# Patient Record
Sex: Female | Born: 1980 | Race: Black or African American | Hispanic: No | Marital: Married | Smoking: Never smoker
Health system: Southern US, Community
[De-identification: ages and names within clinical notes are randomized; demographics above are authoritative.]

## PROBLEM LIST (undated history)

## (undated) DIAGNOSIS — F32A Depression, unspecified: Secondary | ICD-10-CM

## (undated) DIAGNOSIS — D6800 Von Willebrand disease, unspecified: Secondary | ICD-10-CM

## (undated) DIAGNOSIS — G43909 Migraine, unspecified, not intractable, without status migrainosus: Secondary | ICD-10-CM

## (undated) DIAGNOSIS — F329 Major depressive disorder, single episode, unspecified: Secondary | ICD-10-CM

## (undated) DIAGNOSIS — I10 Essential (primary) hypertension: Secondary | ICD-10-CM

## (undated) DIAGNOSIS — J439 Emphysema, unspecified: Secondary | ICD-10-CM

## (undated) DIAGNOSIS — R51 Headache: Secondary | ICD-10-CM

## (undated) DIAGNOSIS — R519 Headache, unspecified: Secondary | ICD-10-CM

## (undated) DIAGNOSIS — R011 Cardiac murmur, unspecified: Secondary | ICD-10-CM

## (undated) DIAGNOSIS — E282 Polycystic ovarian syndrome: Secondary | ICD-10-CM

## (undated) HISTORY — DX: Depression, unspecified: F32.A

## (undated) HISTORY — DX: Major depressive disorder, single episode, unspecified: F32.9

## (undated) HISTORY — DX: Cardiac murmur, unspecified: R01.1

## (undated) HISTORY — PX: DILATION AND CURETTAGE OF UTERUS: SHX78

## (undated) HISTORY — DX: Emphysema, unspecified: J43.9

## (undated) HISTORY — DX: Headache, unspecified: R51.9

## (undated) HISTORY — DX: Migraine, unspecified, not intractable, without status migrainosus: G43.909

## (undated) HISTORY — DX: Essential (primary) hypertension: I10

## (undated) HISTORY — DX: Headache: R51

## (undated) HISTORY — PX: ABDOMINAL HYSTERECTOMY: SHX81

---

## 2009-09-18 HISTORY — PX: CHOLECYSTECTOMY: SHX55

## 2015-05-03 ENCOUNTER — Encounter: Payer: Self-pay | Admitting: Emergency Medicine

## 2015-05-03 ENCOUNTER — Emergency Department
Admission: EM | Admit: 2015-05-03 | Discharge: 2015-05-03 | Disposition: A | Discharge status: Home / Self Care | Payer: Managed Care, Other (non HMO) | Attending: Emergency Medicine | Admitting: Emergency Medicine

## 2015-05-03 ENCOUNTER — Emergency Department: Payer: Managed Care, Other (non HMO)

## 2015-05-03 DIAGNOSIS — Z3202 Encounter for pregnancy test, result negative: Secondary | ICD-10-CM | POA: Diagnosis not present

## 2015-05-03 DIAGNOSIS — R102 Pelvic and perineal pain: Secondary | ICD-10-CM | POA: Insufficient documentation

## 2015-05-03 DIAGNOSIS — R1032 Left lower quadrant pain: Secondary | ICD-10-CM | POA: Insufficient documentation

## 2015-05-03 HISTORY — DX: Polycystic ovarian syndrome: E28.2

## 2015-05-03 HISTORY — DX: Von Willebrand's disease: D68.0

## 2015-05-03 HISTORY — DX: Von Willebrand disease, unspecified: D68.00

## 2015-05-03 LAB — URINALYSIS COMPLETE WITH MICROSCOPIC (ARMC ONLY)
BACTERIA UA: NONE SEEN
Bilirubin Urine: NEGATIVE
Glucose, UA: NEGATIVE mg/dL
Hgb urine dipstick: NEGATIVE
Ketones, ur: NEGATIVE mg/dL
Leukocytes, UA: NEGATIVE
Nitrite: NEGATIVE
PH: 5 (ref 5.0–8.0)
PROTEIN: NEGATIVE mg/dL
Specific Gravity, Urine: 1.025 (ref 1.005–1.030)

## 2015-05-03 LAB — CBC
HEMATOCRIT: 42.5 % (ref 35.0–47.0)
HEMOGLOBIN: 13.8 g/dL (ref 12.0–16.0)
MCH: 27.1 pg (ref 26.0–34.0)
MCHC: 32.5 g/dL (ref 32.0–36.0)
MCV: 83.3 fL (ref 80.0–100.0)
Platelets: 352 10*3/uL (ref 150–440)
RBC: 5.1 MIL/uL (ref 3.80–5.20)
RDW: 13.2 % (ref 11.5–14.5)
WBC: 7.4 10*3/uL (ref 3.6–11.0)

## 2015-05-03 LAB — COMPREHENSIVE METABOLIC PANEL
ALBUMIN: 4.6 g/dL (ref 3.5–5.0)
ALK PHOS: 77 U/L (ref 38–126)
ALT: 40 U/L (ref 14–54)
ANION GAP: 9 (ref 5–15)
AST: 29 U/L (ref 15–41)
BUN: 11 mg/dL (ref 6–20)
CHLORIDE: 104 mmol/L (ref 101–111)
CO2: 24 mmol/L (ref 22–32)
Calcium: 9.4 mg/dL (ref 8.9–10.3)
Creatinine, Ser: 0.84 mg/dL (ref 0.44–1.00)
GFR calc Af Amer: >60 mL/min (ref >60)
GFR calc non Af Amer: >60 mL/min (ref >60)
GLUCOSE: 91 mg/dL (ref 65–99)
POTASSIUM: 3.7 mmol/L (ref 3.5–5.1)
SODIUM: 137 mmol/L (ref 135–145)
Total Bilirubin: 0.6 mg/dL (ref 0.3–1.2)
Total Protein: 8.9 g/dL — ABNORMAL HIGH (ref 6.5–8.1)

## 2015-05-03 LAB — PREGNANCY, URINE: PREG TEST UR: NEGATIVE

## 2015-05-03 LAB — LIPASE, BLOOD: Lipase: 15 U/L — ABNORMAL LOW (ref 22–51)

## 2015-05-03 NOTE — Discharge Instructions (Signed)
Please seek medical attention for any high fevers, chest pain, shortness of breath, change in behavior, persistent vomiting, bloody stool or any other new or concerning symptoms. ° °Abdominal Pain, Women °Abdominal (stomach, pelvic, or belly) pain can be caused by many things. It is important to tell your doctor: °· The location of the pain. °· Does it come and go or is it present all the time? °· Are there things that start the pain (eating certain foods, exercise)? °· Are there other symptoms associated with the pain (fever, nausea, vomiting, diarrhea)? °All of this is helpful to know when trying to find the cause of the pain. °CAUSES  °· Stomach: virus or bacteria infection, or ulcer. °· Intestine: appendicitis (inflamed appendix), regional ileitis (Crohn's disease), ulcerative colitis (inflamed colon), irritable bowel syndrome, diverticulitis (inflamed diverticulum of the colon), or cancer of the stomach or intestine. °· Gallbladder disease or stones in the gallbladder. °· Kidney disease, kidney stones, or infection. °· Pancreas infection or cancer. °· Fibromyalgia (pain disorder). °· Diseases of the female organs: °¨ Uterus: fibroid (non-cancerous) tumors or infection. °¨ Fallopian tubes: infection or tubal pregnancy. °¨ Ovary: cysts or tumors. °¨ Pelvic adhesions (scar tissue). °¨ Endometriosis (uterus lining tissue growing in the pelvis and on the pelvic organs). °¨ Pelvic congestion syndrome (female organs filling up with blood just before the menstrual period). °¨ Pain with the menstrual period. °¨ Pain with ovulation (producing an egg). °¨ Pain with an IUD (intrauterine device, birth control) in the uterus. °¨ Cancer of the female organs. °· Functional pain (pain not caused by a disease, may improve without treatment). °· Psychological pain. °· Depression. °DIAGNOSIS  °Your doctor will decide the seriousness of your pain by doing an examination. °· Blood tests. °· X-rays. °· Ultrasound. °· CT scan  (computed tomography, special type of X-ray). °· MRI (magnetic resonance imaging). °· Cultures, for infection. °· Barium enema (dye inserted in the large intestine, to better view it with X-rays). °· Colonoscopy (looking in intestine with a lighted tube). °· Laparoscopy (minor surgery, looking in abdomen with a lighted tube). °· Major abdominal exploratory surgery (looking in abdomen with a large incision). °TREATMENT  °The treatment will depend on the cause of the pain.  °· Many cases can be observed and treated at home. °· Over-the-counter medicines recommended by your caregiver. °· Prescription medicine. °· Antibiotics, for infection. °· Birth control pills, for painful periods or for ovulation pain. °· Hormone treatment, for endometriosis. °· Nerve blocking injections. °· Physical therapy. °· Antidepressants. °· Counseling with a psychologist or psychiatrist. °· Minor or major surgery. °HOME CARE INSTRUCTIONS  °· Do not take laxatives, unless directed by your caregiver. °· Take over-the-counter pain medicine only if ordered by your caregiver. Do not take aspirin because it can cause an upset stomach or bleeding. °· Try a clear liquid diet (broth or water) as ordered by your caregiver. Slowly move to a bland diet, as tolerated, if the pain is related to the stomach or intestine. °· Have a thermometer and take your temperature several times a day, and record it. °· Bed rest and sleep, if it helps the pain. °· Avoid sexual intercourse, if it causes pain. °· Avoid stressful situations. °· Keep your follow-up appointments and tests, as your caregiver orders. °· If the pain does not go away with medicine or surgery, you may try: °¨ Acupuncture. °¨ Relaxation exercises (yoga, meditation). °¨ Group therapy. °¨ Counseling. °SEEK MEDICAL CARE IF:  °· You notice certain foods cause stomach   pain. °· Your home care treatment is not helping your pain. °· You need stronger pain medicine. °· You want your IUD removed. °· You  feel faint or lightheaded. °· You develop nausea and vomiting. °· You develop a rash. °· You are having side effects or an allergy to your medicine. °SEEK IMMEDIATE MEDICAL CARE IF:  °· Your pain does not go away or gets worse. °· You have a fever. °· Your pain is felt only in portions of the abdomen. The right side could possibly be appendicitis. The left lower portion of the abdomen could be colitis or diverticulitis. °· You are passing blood in your stools (bright red or black tarry stools, with or without vomiting). °· You have blood in your urine. °· You develop chills, with or without a fever. °· You pass out. °MAKE SURE YOU:  °· Understand these instructions. °· Will watch your condition. °· Will get help right away if you are not doing well or get worse. °Document Released: 07/02/2007 Document Revised: 01/19/2014 Document Reviewed: 07/22/2009 °[employer]® Patient Information ©2015 [employer]. This information is not intended to replace advice given to you by your health care provider. Make sure you discuss any questions you have with your health care provider. ° °

## 2015-05-03 NOTE — ED Notes (Signed)
MD at bedside. 

## 2015-05-03 NOTE — ED Provider Notes (Signed)
[facility] Emergency Department Provider Note   ____________________________________________  Time seen: 1605  I have reviewed the triage vital signs and the nursing notes.   HISTORY  Chief Complaint Abdominal Pain   History limited by: Not Limited   HPI Julie Alexander is a 34 y.o. female with history of partial hysterectomy who presents to the emergency department today with concerns for left adnexal pain. Patient states she has a history of PCO last. She has been having pain in her ovaries for the past year. She has not seen a doctor about this yet. She states that 3 weeks ago she had an intense episode of pain in her right lower quadrant which caused her to clams. Today however the pain is most severe in the left side. She has not noticed any fevers. She denies any abnormal defecation or urination. She has not had any abnormal vaginal discharge with this pain. States she has some concerns As cancer runs in her family.     Past Medical History  Diagnosis Date  . PCOS (polycystic ovarian syndrome)   . Von Willebrand disease     There are no active problems to display for this patient.   Past Surgical History  Procedure Laterality Date  . Abdominal hysterectomy      partial  . Cholecystectomy  2011    No current outpatient prescriptions on file.  Allergies Review of patient's allergies indicates no known allergies.  No family history on file.  Social History Social History  Substance Use Topics  . Smoking status: Never Smoker   . Smokeless tobacco: None  . Alcohol Use: No    Review of Systems  Constitutional: Negative for fever. Cardiovascular: Negative for chest pain. Respiratory: Negative for shortness of breath. Gastrointestinal: Left lower quadrant pain Genitourinary: Negative for dysuria. Musculoskeletal: Negative for back pain. Skin: Negative for rash. Neurological: Negative for headaches, focal weakness or  numbness.   10-point ROS otherwise negative.  ____________________________________________   PHYSICAL EXAM:  VITAL SIGNS: ED Triage Vitals  Enc Vitals Group     BP 05/03/15 1445 152/103 mmHg     Pulse Rate 05/03/15 1445 83     Resp 05/03/15 1445 19     Temp 05/03/15 1445 97.9 F (36.6 C)     Temp Source 05/03/15 1445 Oral     SpO2 05/03/15 1445 100 %     Weight 05/03/15 1445 200 lb (90.719 kg)     Height 05/03/15 1445  (1.575 m)     Head Cir --      Peak Flow --      Pain Score 05/03/15 1446 8   Constitutional: Alert and oriented. Well appearing and in no distress. Eyes: Conjunctivae are normal. PERRL. Normal extraocular movements. ENT   Head: Normocephalic and atraumatic.   Nose: No congestion/rhinnorhea.   Mouth/Throat: Mucous membranes are moist.   Neck: No stridor. Hematological/Lymphatic/Immunilogical: No cervical lymphadenopathy. Cardiovascular: Normal rate, regular rhythm.  No murmurs, rubs, or gallops. Respiratory: Normal respiratory effort without tachypnea nor retractions. Breath sounds are clear and equal bilaterally. No wheezes/rales/rhonchi. Gastrointestinal: Soft. Tender to palpation in the left lower quadrant, left adnexa. No rebound. No guarding. Genitourinary: Deferred Musculoskeletal: Normal range of motion in all extremities. No joint effusions.  No lower extremity tenderness nor edema. Neurologic:  Normal speech and language. No gross focal neurologic deficits are appreciated. Speech is normal.  Skin:  Skin is warm, dry and intact. No rash noted. Psychiatric: Mood and affect are normal. Speech  and behavior are normal. Patient exhibits appropriate insight and judgment.  ____________________________________________    LABS (pertinent positives/negatives)  Labs Reviewed  LIPASE, BLOOD - Abnormal; Notable for the following:    Lipase 15 (*)    All other components within normal limits  COMPREHENSIVE METABOLIC PANEL - Abnormal;  Notable for the following:    Total Protein 8.9 (*)    All other components within normal limits  URINALYSIS COMPLETEWITH MICROSCOPIC ([facility] ONLY) - Abnormal; Notable for the following:    Color, Urine YELLOW (*)    APPearance CLEAR (*)    Squamous Epithelial / LPF 0-5 (*)    All other components within normal limits  CBC  PREGNANCY, URINE     ____________________________________________   EKG  None  ____________________________________________    RADIOLOGY  Transvaginal US IMPRESSION: Status post hysterectomy. Unremarkable pelvic ultrasound. No evidence for ovarian torsion.  ____________________________________________   PROCEDURES  Procedure(s) performed: None  Critical Care performed: No  ____________________________________________   INITIAL IMPRESSION / ASSESSMENT AND PLAN / ED COURSE  Pertinent labs & imaging results that were available during my care of the patient were reviewed by me and considered in my medical decision making (see chart for details).  Patient presented to the emergency department today with left adnexal pain. On exam patient appears well with mild left adnexal tenderness. Ultrasound did not show any concerning findings with the ovaries. Additionally blood work without any leukocytosis. At this point will plan on outpatient follow-up with OB/GYN. Patient did not feel exam was also at this time given monogamous relationship. Additionally patient does have a partial hysterectomy affect PID extremely unlikely.  ____________________________________________   FINAL CLINICAL IMPRESSION(S) / ED DIAGNOSES  Final diagnoses:  Left adnexal tenderness  Left adnexal tenderness     Phineas Semen, MD 05/03/15 1940

## 2015-05-03 NOTE — ED Notes (Signed)
Patient transported to Ultrasound 

## 2015-05-03 NOTE — ED Notes (Signed)
Pt reports pain in ovaries for "some time", reports increased pain and increased "bladder tightness". Pt reports hx of partial hysterectomy and PCOS. Pt denies any vaginal bleeding or discharge.

## 2016-01-16 ENCOUNTER — Emergency Department
Admission: EM | Admit: 2016-01-16 | Discharge: 2016-01-16 | Disposition: A | Discharge status: Home / Self Care | Payer: Managed Care, Other (non HMO) | Attending: Emergency Medicine | Admitting: Emergency Medicine

## 2016-01-16 DIAGNOSIS — F419 Anxiety disorder, unspecified: Secondary | ICD-10-CM | POA: Insufficient documentation

## 2016-01-16 DIAGNOSIS — K529 Noninfective gastroenteritis and colitis, unspecified: Secondary | ICD-10-CM | POA: Diagnosis not present

## 2016-01-16 DIAGNOSIS — R1110 Vomiting, unspecified: Secondary | ICD-10-CM | POA: Diagnosis present

## 2016-01-16 LAB — COMPREHENSIVE METABOLIC PANEL
ALBUMIN: 5 g/dL (ref 3.5–5.0)
ALK PHOS: 83 U/L (ref 38–126)
ALT: 28 U/L (ref 14–54)
AST: 22 U/L (ref 15–41)
Anion gap: 12 (ref 5–15)
BILIRUBIN TOTAL: 1.1 mg/dL (ref 0.3–1.2)
BUN: 12 mg/dL (ref 6–20)
CALCIUM: 9.6 mg/dL (ref 8.9–10.3)
CO2: 21 mmol/L — ABNORMAL LOW (ref 22–32)
CREATININE: 0.83 mg/dL (ref 0.44–1.00)
Chloride: 106 mmol/L (ref 101–111)
GFR calc Af Amer: >60 mL/min (ref >60)
GLUCOSE: 109 mg/dL — AB (ref 65–99)
Potassium: 3.3 mmol/L — ABNORMAL LOW (ref 3.5–5.1)
Sodium: 139 mmol/L (ref 135–145)
TOTAL PROTEIN: 8.8 g/dL — AB (ref 6.5–8.1)

## 2016-01-16 LAB — CBC
HEMATOCRIT: 44.8 % (ref 35.0–47.0)
HEMOGLOBIN: 14.9 g/dL (ref 12.0–16.0)
MCH: 27.5 pg (ref 26.0–34.0)
MCHC: 33.2 g/dL (ref 32.0–36.0)
MCV: 82.9 fL (ref 80.0–100.0)
Platelets: 339 10*3/uL (ref 150–440)
RBC: 5.41 MIL/uL — ABNORMAL HIGH (ref 3.80–5.20)
RDW: 13.2 % (ref 11.5–14.5)
WBC: 9.1 10*3/uL (ref 3.6–11.0)

## 2016-01-16 LAB — GLUCOSE, CAPILLARY: GLUCOSE-CAPILLARY: 101 mg/dL — AB (ref 65–99)

## 2016-01-16 LAB — LIPASE, BLOOD: Lipase: 14 U/L (ref 11–51)

## 2016-01-16 MED ORDER — SODIUM CHLORIDE 0.9 % IV SOLN
1000.0000 mL | Freq: Once | INTRAVENOUS | Status: AC
  Administered 2016-01-16: 1000 mL via INTRAVENOUS

## 2016-01-16 MED ORDER — ONDANSETRON HCL 4 MG PO TABS: 4.0000 mg | ORAL_TABLET | Freq: Every day | ORAL | Status: DC | PRN

## 2016-01-16 MED ORDER — ONDANSETRON HCL 4 MG/2ML IJ SOLN
4.0000 mg | Freq: Once | INTRAMUSCULAR | Status: AC
  Administered 2016-01-16: 4 mg via INTRAVENOUS
  Filled 2016-01-16: qty 2

## 2016-01-16 NOTE — ED Notes (Signed)
Pt reports upper abd pain along with vomiting and diarrhea stats she has had about 30 episodes of both since this morning at 1200.

## 2016-01-16 NOTE — ED Provider Notes (Signed)
[facility] Emergency Department Provider Note  ____________________________________________    I have reviewed the triage vital signs and the nursing notes.   HISTORY  Chief Complaint Emesis and Diarrhea    HPI Julie Alexander is a 35 y.o. female who presents with complaints of nausea vomiting and diarrhea which started earlier today. She reports she has had nearly nonstop vomiting and watery diarrhea. She feels she may have food poisoning. She complains of epigastric discomfort which is cramping in nature. No fevers or chills. No recent travel.     Past Medical History  Diagnosis Date  . PCOS (polycystic ovarian syndrome)   . Von Willebrand disease     There are no active problems to display for this patient.   Past Surgical History  Procedure Laterality Date  . Abdominal hysterectomy      partial  . Cholecystectomy  2011    No current outpatient prescriptions on file.  Allergies Review of patient's allergies indicates no known allergies.  No family history on file.  Social History Social History  Substance Use Topics  . Smoking status: Never Smoker   . Smokeless tobacco: Not on file  . Alcohol Use: No    Review of Systems  Constitutional: Negative for fever. Eyes: Negative for redness ENT: Negative for sore throat Cardiovascular: Negative for chest pain Respiratory: Negative for cough Gastrointestinal: As above Genitourinary: Negative for dysuria. Musculoskeletal: Negative for back pain. Skin: Negative for rash. Neurological: Negative for focal weakness Psychiatric: Positive for anxiety    ____________________________________________   PHYSICAL EXAM:  VITAL SIGNS: ED Triage Vitals  Enc Vitals Group     BP 01/16/16 1339 126/81 mmHg     Pulse Rate 01/16/16 1339 99     Resp 01/16/16 1339 26     Temp 01/16/16 1339 99.3 F (37.4 C)     Temp Source 01/16/16 1339 Oral     SpO2 01/16/16 1339 99 %     Weight 01/16/16  1339 200 lb (90.719 kg)     Height 01/16/16 1339  (1.575 m)     Head Cir --      Peak Flow --      Pain Score --      Pain Loc --      Pain Edu? --      Excl. in GC? --      Constitutional: Alert and oriented. Anxious and uncomfortable Eyes: Conjunctivae are normal. No erythema or injection ENT   Head: Normocephalic and atraumatic.   Mouth/Throat: Mucous membranes are moist. Cardiovascular: Normal rate, regular rhythm. Normal and symmetric distal pulses are present in the upper extremities.  Respiratory: Normal respiratory effort without tachypnea nor retractions. Breath sounds are clear and equal bilaterally.  Gastrointestinal: Mild tenderness in epigastrium.. No distention. There is no CVA tenderness. Genitourinary: deferred Musculoskeletal: Nontender with normal range of motion in all extremities. No lower extremity tenderness nor edema. Neurologic:  Normal speech and language. No gross focal neurologic deficits are appreciated. Skin:  Skin is warm, dry and intact. No rash noted. Psychiatric:  Patient exhibits appropriate insight and judgment.  ____________________________________________    LABS (pertinent positives/negatives)  Labs Reviewed  GLUCOSE, CAPILLARY - Abnormal; Notable for the following:    Glucose-Capillary 101 (*)    All other components within normal limits  CBC  COMPREHENSIVE METABOLIC PANEL  LIPASE, BLOOD  URINALYSIS COMPLETEWITH MICROSCOPIC ([facility] ONLY)    ____________________________________________   EKG  ED ECG REPORT I, Jene Every, the attending physician, personally  viewed and interpreted this ECG.  Date: 01/16/2016 EKG Time: 1:47 PM Rate: 92 Rhythm: normal sinus rhythm QRS Axis: normal Intervals: normal ST/T Wave abnormalities: normal Conduction Disturbances: none Narrative Interpretation: unremarkable   ____________________________________________     RADIOLOGY  None  ____________________________________________   PROCEDURES  Procedure(s) performed: none  Critical Care performed:none  ____________________________________________   INITIAL IMPRESSION / ASSESSMENT AND PLAN / ED COURSE  Pertinent labs & imaging results that were available during my care of the patient were reviewed by me and considered in my medical decision making (see chart for details).  Patient presents with nausea vomiting and diarrhea. She has mild tenderness over the epigastrium. We will give IV Zofran, check labs urine and reevaluate.  Patient feeling slightly better after treatment. Laboratory is unremarkable. She is no longer vomiting. We will discharge the patient with Zofran Rx. Did discuss return precautions.  ____________________________________________   FINAL CLINICAL IMPRESSION(S) / ED DIAGNOSES  Final diagnoses:  Gastroenteritis          Jene Everyobert Salisa Broz, MD 01/16/16 [phone]

## 2016-08-06 NOTE — Progress Notes (Deleted)
   Subjective:    Patient ID: Julie Alexander, female    DOB: 09/05/1981, 35 y.o.   MRN: [id number]  CC: Julie Alexander is a 35 y.o. female who presents today to establish care.    HPI: Here as new patient to establish care.      HISTORY:  Past Medical History:  Diagnosis Date  . PCOS (polycystic ovarian syndrome)   . Von Willebrand disease    Past Surgical History:  Procedure Laterality Date  . ABDOMINAL HYSTERECTOMY     partial  . CHOLECYSTECTOMY  2011   No family history on file.  Allergies: Patient has no known allergies. Current Outpatient Prescriptions on File Prior to Visit  Medication Sig Dispense Refill  . ondansetron (ZOFRAN) 4 MG tablet Take 1 tablet (4 mg total) by mouth daily as needed for nausea or vomiting. 20 tablet 1   No current facility-administered medications on file prior to visit.     Social History  Substance Use Topics  . Smoking status: Never Smoker  . Smokeless tobacco: Not on file  . Alcohol use No    Review of Systems    Objective:    There were no vitals taken for this visit. BP Readings from Last 3 Encounters:  01/16/16 132/76  05/03/15 139/85   Wt Readings from Last 3 Encounters:  01/16/16 200 lb (90.7 kg)  05/03/15 200 lb (90.7 kg)    Physical Exam     Assessment & Plan:   Problem List Items Addressed This Visit    None       I am having Ms. Alexander maintain her ondansetron.   No orders of the defined types were placed in this encounter.   Return precautions given.   Risks, benefits, and alternatives of the medications and treatment plan prescribed today were discussed, and patient expressed understanding.   Education regarding symptom management and diagnosis given to patient on AVS.  Continue to follow with No PCP Per Patient for routine health maintenance.   Julie Alexander and I agreed with plan.   Rennie PlowmanMargaret Karlie Aung, FNP

## 2016-08-07 ENCOUNTER — Ambulatory Visit: Payer: Managed Care, Other (non HMO) | Admitting: Family

## 2016-08-07 DIAGNOSIS — Z0289 Encounter for other administrative examinations: Secondary | ICD-10-CM

## 2016-10-09 ENCOUNTER — Ambulatory Visit: Payer: Managed Care, Other (non HMO) | Admitting: Family Medicine

## 2016-11-08 ENCOUNTER — Emergency Department
Admission: EM | Admit: 2016-11-08 | Discharge: 2016-11-08 | Disposition: A | Discharge status: Home / Self Care | Payer: Managed Care, Other (non HMO) | Attending: Student in an Organized Health Care Education/Training Program | Admitting: Student in an Organized Health Care Education/Training Program

## 2016-11-08 ENCOUNTER — Encounter: Payer: Self-pay | Admitting: Emergency Medicine

## 2016-11-08 DIAGNOSIS — R42 Dizziness and giddiness: Secondary | ICD-10-CM | POA: Insufficient documentation

## 2016-11-08 DIAGNOSIS — Z5321 Procedure and treatment not carried out due to patient leaving prior to being seen by health care provider: Secondary | ICD-10-CM | POA: Insufficient documentation

## 2016-11-08 DIAGNOSIS — R002 Palpitations: Secondary | ICD-10-CM | POA: Insufficient documentation

## 2016-11-08 DIAGNOSIS — Z79899 Other long term (current) drug therapy: Secondary | ICD-10-CM | POA: Insufficient documentation

## 2016-11-08 NOTE — ED Notes (Signed)
Pt does not want to have any blood work done at this time and is thinking about leaving.

## 2016-11-08 NOTE — ED Triage Notes (Signed)
Pt with dizziness and palpatations started last night at work and some today, no pain.

## 2016-11-14 ENCOUNTER — Ambulatory Visit (INDEPENDENT_AMBULATORY_CARE_PROVIDER_SITE_OTHER): Payer: Managed Care, Other (non HMO) | Admitting: Family

## 2016-11-14 ENCOUNTER — Encounter: Payer: Self-pay | Admitting: Family

## 2016-11-14 VITALS — BP 156/100 | HR 86 | Temp 98.3°F | Ht 62.0 in | Wt 231.2 lb

## 2016-11-14 DIAGNOSIS — R002 Palpitations: Secondary | ICD-10-CM | POA: Insufficient documentation

## 2016-11-14 DIAGNOSIS — I10 Essential (primary) hypertension: Secondary | ICD-10-CM | POA: Diagnosis not present

## 2016-11-14 DIAGNOSIS — F419 Anxiety disorder, unspecified: Secondary | ICD-10-CM | POA: Diagnosis not present

## 2016-11-14 LAB — CBC WITH DIFFERENTIAL/PLATELET
BASOS ABS: 0.1 10*3/uL (ref 0.0–0.1)
Basophils Relative: 1.2 % (ref 0.0–3.0)
EOS ABS: 0.1 10*3/uL (ref 0.0–0.7)
Eosinophils Relative: 0.8 % (ref 0.0–5.0)
HEMATOCRIT: 41.2 % (ref 36.0–46.0)
HEMOGLOBIN: 13.5 g/dL (ref 12.0–15.0)
LYMPHS PCT: 35.5 % (ref 12.0–46.0)
Lymphs Abs: 2.8 10*3/uL (ref 0.7–4.0)
MCHC: 32.9 g/dL (ref 30.0–36.0)
MCV: 84.4 fl (ref 78.0–100.0)
Monocytes Absolute: 0.3 10*3/uL (ref 0.1–1.0)
Monocytes Relative: 3.5 % (ref 3.0–12.0)
Neutro Abs: 4.7 10*3/uL (ref 1.4–7.7)
Neutrophils Relative %: 59 % (ref 43.0–77.0)
PLATELETS: 385 10*3/uL (ref 150.0–400.0)
RBC: 4.88 Mil/uL (ref 3.87–5.11)
RDW: 13 % (ref 11.5–15.5)
WBC: 8 10*3/uL (ref 4.0–10.5)

## 2016-11-14 LAB — LIPID PANEL
CHOLESTEROL: 246 mg/dL — AB (ref 0–200)
HDL: 50 mg/dL (ref >39.00)
LDL CALC: 174 mg/dL — AB (ref 0–99)
NonHDL: 195.64
Total CHOL/HDL Ratio: 5
Triglycerides: 110 mg/dL (ref 0.0–149.0)
VLDL: 22 mg/dL (ref 0.0–40.0)

## 2016-11-14 LAB — COMPREHENSIVE METABOLIC PANEL
ALBUMIN: 4.4 g/dL (ref 3.5–5.2)
ALT: 28 U/L (ref 0–35)
AST: 18 U/L (ref 0–37)
Alkaline Phosphatase: 80 U/L (ref 39–117)
BILIRUBIN TOTAL: 0.5 mg/dL (ref 0.2–1.2)
BUN: 9 mg/dL (ref 6–23)
CALCIUM: 9.7 mg/dL (ref 8.4–10.5)
CO2: 28 meq/L (ref 19–32)
CREATININE: 0.79 mg/dL (ref 0.40–1.20)
Chloride: 105 mEq/L (ref 96–112)
GFR: 105.32 mL/min (ref >60.00)
Glucose, Bld: 95 mg/dL (ref 70–99)
Potassium: 3.7 mEq/L (ref 3.5–5.1)
Sodium: 140 mEq/L (ref 135–145)
Total Protein: 7.9 g/dL (ref 6.0–8.3)

## 2016-11-14 LAB — VITAMIN D 25 HYDROXY (VIT D DEFICIENCY, FRACTURES): VITD: 8.15 ng/mL — AB (ref 30.00–100.00)

## 2016-11-14 LAB — TSH: TSH: 1.62 u[IU]/mL (ref 0.35–4.50)

## 2016-11-14 LAB — HEMOGLOBIN A1C: Hgb A1c MFr Bld: 6 % (ref 4.6–6.5)

## 2016-11-14 NOTE — Assessment & Plan Note (Addendum)
Patient is not having palpitations at this time. She politely declines repeating her EKG today as I advised. This is done in the ER 2/22 and did show NSR.  We jointly decided referral cardiology's appropriate next step as patient may need Holter monitor, echo.

## 2016-11-14 NOTE — Progress Notes (Signed)
Pre visit review using our clinic review tool, if applicable. No additional management support is needed unless otherwise documented below in the visit note. 

## 2016-11-14 NOTE — Patient Instructions (Signed)
Labs today  Will start BP medication.   Please let us know pharmacy.   Follow up 2 weeks for BP.   Managing Your Hypertension Hypertension is commonly called high blood pressure. This is when the force of your blood pressing against the walls of your arteries is too strong. Arteries are blood vessels that carry blood from your heart throughout your body. Hypertension forces the heart to work harder to pump blood, and may cause the arteries to become narrow or stiff. Having untreated or uncontrolled hypertension can cause heart attack, stroke, kidney disease, and other problems. What are blood pressure readings? A blood pressure reading consists of a higher number over a lower number. Ideally, your blood pressure should be below 120/80. The first ("top") number is called the systolic pressure. It is a measure of the pressure in your arteries as your heart beats. The second ("bottom") number is called the diastolic pressure. It is a measure of the pressure in your arteries as the heart relaxes. What does my blood pressure reading mean? Blood pressure is classified into four stages. Based on your blood pressure reading, your health care provider may use the following stages to determine what type of treatment you need, if any. Systolic pressure and diastolic pressure are measured in a unit called mm Hg. Normal   Systolic pressure: below 120.  Diastolic pressure: below 80. Elevated   Systolic pressure: 120-129.  Diastolic pressure: below 80. Hypertension stage 1     Diastolic pressure: 80-89. Hypertension stage 2   Systolic pressure: 140 or above.  Diastolic pressure: 90 or above. What health risks are associated with hypertension? Managing your hypertension is an important responsibility. Uncontrolled hypertension can lead to:  A heart attack.  A stroke.  A weakened blood vessel (aneurysm).  Heart failure.  Kidney damage.  Eye damage.  Metabolic syndrome.  Memory and  concentration problems. What changes can I make to manage my hypertension? Eating and drinking   Eat a diet that is high in fiber and potassium, and low in salt (sodium), added sugar, and fat. An example eating plan is called the DASH (Dietary Approaches to Stop Hypertension) diet. To eat this way:  Eat plenty of fresh fruits and vegetables. Try to fill half of your plate at each meal with fruits and vegetables.  Eat whole grains, such as whole wheat pasta, brown rice, or whole grain bread. Fill about one quarter of your plate with whole grains.  Eat low-fat diary products.  Avoid fatty cuts of meat, processed or cured meats, and poultry with skin. Fill about one quarter of your plate with lean proteins such as fish, chicken without skin, beans, eggs, and tofu.  Avoid premade and processed foods. These tend to be higher in sodium, added sugar, and fat.     Lifestyle   Work with your health care provider to maintain a healthy body weight, or to lose weight. Ask what an ideal weight is for you.  Get at least 30 minutes of exercise that causes your heart to beat faster (aerobic exercise) most days of the week. Activities may include walking, swimming, or biking.       Monitoring   Monitor your blood pressure at home as told by your health care provider. Your personal target blood pressure may vary depending on your medical conditions, your age, and other factors.  Have your blood pressure checked regularly, as often as told by your health care provider. Working with your health care provider  Review all the medicines you take with your health care provider because there may be side effects or interactions.  Talk with your health care provider about your diet, exercise habits, and other lifestyle factors that may be contributing to hypertension.  Visit your health care provider regularly. Your health care provider can help you create and adjust your plan for managing  hypertension. Will I need medicine to control my blood pressure? Your health care provider may prescribe medicine if lifestyle changes are not enough to get your blood pressure under control, and if:  Your systolic blood pressure is 130 or higher.  Your diastolic blood pressure is 80 or higher. Take medicines only as told by your health care provider. Follow the directions carefully. Blood pressure medicines must be taken as prescribed. The medicine does not work as well when you skip doses. Skipping doses also puts you at risk for problems. Contact a health care provider if:  You think you are having a reaction to medicines you have taken.  You have repeated (recurrent) headaches.  You feel dizzy.  You have swelling in your ankles.  You have trouble with your vision. Get help right away if:  You develop a severe headache or confusion.  You have unusual weakness or numbness, or you feel faint.  You have severe pain in your chest or abdomen.  You vomit repeatedly.  You have trouble breathing. Summary  Hypertension is when the force of blood pumping through your arteries is too strong. If this condition is not controlled, it may put you at risk for serious complications.  Your personal target blood pressure may vary depending on your medical conditions, your age, and other factors. For most people, a normal blood pressure is less than 120/80.  Hypertension is managed by lifestyle changes, medicines, or both. Lifestyle changes include weight loss, eating a healthy, low-sodium diet, exercising more, and limiting alcohol. This information is not intended to replace advice given to you by your health care provider. Make sure you discuss any questions you have with your health care provider. Document Released: 05/29/2012 Document Revised: 08/02/2016 Document Reviewed: 08/02/2016 Elsevier Interactive Patient Education  2017 [employer].

## 2016-11-14 NOTE — Assessment & Plan Note (Signed)
Declines medication at this time. Referral to counseling.

## 2016-11-14 NOTE — Assessment & Plan Note (Addendum)
No CP, SOB. BP elevated today and in ER last week ( 150/88). Normal GFR. Start amlodipine. Follow up 2 weeks.

## 2016-11-14 NOTE — Progress Notes (Signed)
Subjective:    Patient ID: Julie Alexander, female    DOB: 04/12/1981, 36 y.o.   MRN: [id number]  CC: Julie Alexander is a 36 y.o. female who presents today to establish care.    HPI: Left [place] and hasn't had PCP.   Went to the ER last week for heart palpitations which started over past 2 months. 'Feels overly stressed.' Has feeling when gets irritated such as when gets 'flustered or colleague made her irritable. Lasts a few seconds and resolves on its own. No caffeine. No CP with exercise on ellipital or when carries groceries up the stairs. No panic attacks. Has seen counselor in the past, no medications.   Denies exertional chest pain or pressure, numbness or tingling radiating to left arm or jaw, palpitations, dizziness, frequent headaches, changes in vision, or shortness of breath.  No h/o HTN.       HISTORY:  Past Medical History:  Diagnosis Date  . Depression   . Emphysema of lung (HCC)   . Frequent headaches   . Heart murmur   . Migraines   . PCOS (polycystic ovarian syndrome)   . Von Willebrand disease (HCC)    Past Surgical History:  Procedure Laterality Date  . ABDOMINAL HYSTERECTOMY     partial  . CHOLECYSTECTOMY  2011   Family History  Problem Relation Age of Onset  . Cancer Maternal Aunt     cancer    Allergies: Patient has no known allergies. No current outpatient prescriptions on file prior to visit.   No current facility-administered medications on file prior to visit.     Social History  Substance Use Topics  . Smoking status: Never Smoker  . Smokeless tobacco: Never Used  . Alcohol use No    Review of Systems  Constitutional: Negative for chills and fever.  Eyes: Negative for visual disturbance.  Respiratory: Negative for cough.   Cardiovascular: Positive for palpitations. Negative for chest pain.  Gastrointestinal: Negative for nausea and vomiting.  Neurological: Negative for headaches.  Psychiatric/Behavioral: Negative for suicidal  ideas. The patient is nervous/anxious.       Objective:    BP (!) 156/100   Pulse 86   Temp 98.3 F (36.8 C) (Oral)   Ht 5\' 2"  (1.575 m)   Wt 231 lb 3.2 oz (104.9 kg)   SpO2 98%   BMI 42.29 kg/m  BP Readings from Last 3 Encounters:  11/14/16 (!) 156/100  11/08/16 (!) 150/88  01/16/16 132/76   Wt Readings from Last 3 Encounters:  11/14/16 231 lb 3.2 oz (104.9 kg)  11/08/16 200 lb (90.7 kg)  01/16/16 200 lb (90.7 kg)    Physical Exam  Constitutional: She appears well-developed and well-nourished.  Eyes: Conjunctivae are normal.  Cardiovascular: Normal rate, regular rhythm, normal heart sounds and normal pulses.   Pulmonary/Chest: Effort normal and breath sounds normal. She has no wheezes. She has no rhonchi. She has no rales.  Neurological: She is alert.  Skin: Skin is warm and dry.  Psychiatric: She has a normal mood and affect. Her speech is normal and behavior is normal. Thought content normal.  Vitals reviewed.      Assessment & Plan:   Problem List Items Addressed This Visit      Cardiovascular and Mediastinum   Hypertension - Primary    No CP, SOB. BP elevated today and in ER last week ( 150/88). Normal GFR. Start amlodipine. Follow up 2 weeks.      Relevant Orders  CBC with Differential/Platelet (Completed)   Comprehensive metabolic panel (Completed)   Hemoglobin A1c (Completed)   Lipid panel (Completed)   TSH (Completed)   VITAMIN D 25 Hydroxy (Vit-D Deficiency, Fractures) (Completed)     Other   Anxiety    Declines medication at this time. Referral to counseling.       Relevant Orders   Ambulatory referral to Psychiatry   Palpitations    Patient is not having palpitations at this time. She politely declines repeating her EKG today as I advised. This is done in the ER 2/22 and did show NSR.  We jointly decided referral cardiology's appropriate next step as patient may need Holter monitor, echo.      Relevant Orders   Ambulatory referral to  Cardiology       I have discontinued Ms. Alexander's ondansetron.   No orders of the defined types were placed in this encounter.   Return precautions given.   Risks, benefits, and alternatives of the medications and treatment plan prescribed today were discussed, and patient expressed understanding.   Education regarding symptom management and diagnosis given to patient on AVS.  Continue to follow with Rennie Plowman, FNP for routine health maintenance.   Julie Paradise and I agreed with plan.   Rennie Plowman, FNP

## 2016-11-15 ENCOUNTER — Telehealth: Payer: Self-pay

## 2016-11-15 MED ORDER — AMLODIPINE BESYLATE 2.5 MG PO TABS: 2.5000 mg | ORAL_TABLET | Freq: Every day | ORAL | 0 refills | Status: DC

## 2016-11-15 NOTE — Telephone Encounter (Signed)
Per verbal orders from Rennie PlowmanMargaret Arnett , FNP ok to call amlodipine  2.5 mg #90. Called and advised patient called in script .

## 2016-11-15 NOTE — Telephone Encounter (Signed)
Patient calls stating she was seen yesterday BP medication was to be called into pharmacy can you call please.  It looks like script was going to be for amlodipine no strength . Please advise. Thanks.

## 2016-11-17 ENCOUNTER — Encounter: Payer: Self-pay | Admitting: *Deleted

## 2016-12-06 NOTE — Progress Notes (Signed)
Cardiology Office Note   Date:  12/07/2016   ID:  Julie ParadiseJaye A Alexander, DOB 06/21/1981, MRN [id number]  Referring Doctor:  Rennie PlowmanMargaret Arnett, FNP   Cardiologist:   Almond LintAileen Shantinique Picazo, MD   Reason for consultation:  Chief Complaint  Patient presents with  . OTHER    Heart Palpitations. Pt refused EKG. Meds reviewed verbally with pt.      History of Present Illness: Julie PyleJaye A Alois Alexander is a 36 y.o. female who presents for Palpitations. Onset one month. Feels that her heart is flip-flopping, fluttering in the chest. In the beginning, it was all throughout the day, on and off, symptoms mainly in the chest. Not related to anything in particular. Somewhat did significantly improve after initiation of blood pressure medication. However, she had another episode yesterday but it was very short in duration, similar in presentation. She has some shortness of breath during the episode of palpitations.  Patient reports that she is physically active goes to gym try to lose weight. She has no symptoms with exertion no chest pain or shortness of breath.   ROS:  Please see the history of present illness. Aside from mentioned under HPI, all other systems are reviewed and negative.     Past Medical History:  Diagnosis Date  . Depression   . Emphysema of lung (HCC)   . Frequent headaches   . Heart murmur   . Hypertension   . Migraines   . PCOS (polycystic ovarian syndrome)   . Von Willebrand disease (HCC)     Past Surgical History:  Procedure Laterality Date  . ABDOMINAL HYSTERECTOMY     partial  . CHOLECYSTECTOMY  2011  . DILATION AND CURETTAGE OF UTERUS       reports that she has never smoked. She has never used smokeless tobacco. She reports that she does not drink alcohol or use drugs.   family history includes Cancer in her maternal aunt; Hypertension in her mother.   Outpatient Medications Prior to Visit  Medication Sig Dispense Refill  . amLODipine (NORVASC) 2.5 MG tablet Take 1 tablet (2.5  mg total) by mouth daily. 90 tablet 0   No facility-administered medications prior to visit.      Allergies: Patient has no known allergies.    PHYSICAL EXAM: VS:  BP (!) 126/96 (BP Location: Right Arm, Patient Position: Sitting, Cuff Size: Large)   Pulse 93   Ht 5\' 2"  (1.575 m)   Wt 233 lb 8 oz (105.9 kg)   BMI 42.71 kg/m  , Body mass index is 42.71 kg/m. Wt Readings from Last 3 Encounters:  12/07/16 233 lb 8 oz (105.9 kg)  11/14/16 231 lb 3.2 oz (104.9 kg)  11/08/16 200 lb (90.7 kg)    GENERAL:  well developed, well nourished, obese, not in acute distress HEENT: normocephalic, pink conjunctivae, anicteric sclerae, no xanthelasma, normal dentition, oropharynx clear NECK:  no neck vein engorgement, JVP normal, no hepatojugular reflux, carotid upstroke brisk and symmetric, no bruit, no thyromegaly, no lymphadenopathy LUNGS:  good respiratory effort, clear to auscultation bilaterally CV:  PMI not displaced, no thrills, no lifts, S1 and S2 within normal limits, no palpable S3 or S4, no murmurs, no rubs, no gallops ABD:  Soft, nontender, nondistended, normoactive bowel sounds, no abdominal aortic bruit, no hepatomegaly, no splenomegaly MS: nontender back, no kyphosis, no scoliosis, no joint deformities EXT:  2+ DP/PT pulses, no edema, no varicosities, no cyanosis, no clubbing SKIN: warm, nondiaphoretic, normal turgor, no ulcers NEUROPSYCH: alert, oriented  to person, place, and time, sensory/motor grossly intact, normal mood, appropriate affect  Recent Labs: 11/14/2016: ALT 28; BUN 9; Creatinine, Ser 0.79; Hemoglobin 13.5; Platelets 385.0; Potassium 3.7; Sodium 140; TSH 1.62   Lipid Panel    Component Value Date/Time   CHOL 246 (H) 11/14/2016 0937   TRIG 110.0 11/14/2016 0937   HDL 50.00 11/14/2016 0937   CHOLHDL 5 11/14/2016 0937   VLDL 22.0 11/14/2016 0937   LDLCALC 174 (H) 11/14/2016 0937     Other studies Reviewed:  EKG:  The ekg from 11/08/2016 was personally reviewed  by me and it revealed sinus rhythm possible LVH. Poor R-wave progression. Nonspecific ST changes. Abnormal EKG.  Additional studies/ records that were reviewed personally reviewed by me today include: None available   ASSESSMENT AND PLAN: Palpitations Nonspecific ST-T wave changes, abnormal EKG Recommend long-term monitor. Recommend echocardiogram.  Hypertension Being management PCP. If she is found to have ectopy on the monitor, should consider switching to beta blocker for blood pressure management. Lifestyle changes recommended.  Obesity Body mass index is 42.71 kg/m. Recommend aggressive weight loss through diet and increased physical activity.   Current medicines are reviewed at length with the patient today.  The patient does not have concerns regarding medicines.  Labs/ tests ordered today include:  Orders Placed This Encounter  Procedures  . LONG TERM MONITOR (3-14 DAYS)  . ECHOCARDIOGRAM COMPLETE    I had a lengthy and detailed discussion with the patient regarding diagnoses, prognosis, diagnostic options, treatment options , and side effects of medications.   I counseled the patient on importance of lifestyle modification including heart healthy diet, regular physical activity.   Disposition:   FU with Cardiology after tests   Thank you for this consultation. We will forwarding this consultation to referring physician.   Signed, Almond Lint, MD  12/07/2016 10:24 AM    [facility]  This note was generated in part with voice recognition software and I apologize for any typographical errors that were not detected and corrected.

## 2016-12-07 ENCOUNTER — Ambulatory Visit (INDEPENDENT_AMBULATORY_CARE_PROVIDER_SITE_OTHER): Payer: Managed Care, Other (non HMO)

## 2016-12-07 ENCOUNTER — Ambulatory Visit (INDEPENDENT_AMBULATORY_CARE_PROVIDER_SITE_OTHER): Payer: Managed Care, Other (non HMO) | Admitting: Cardiology

## 2016-12-07 ENCOUNTER — Encounter: Payer: Self-pay | Admitting: Cardiology

## 2016-12-07 VITALS — BP 126/96 | HR 93 | Ht 62.0 in | Wt 233.5 lb

## 2016-12-07 DIAGNOSIS — I10 Essential (primary) hypertension: Secondary | ICD-10-CM

## 2016-12-07 DIAGNOSIS — R002 Palpitations: Secondary | ICD-10-CM

## 2016-12-07 DIAGNOSIS — E6609 Other obesity due to excess calories: Secondary | ICD-10-CM

## 2016-12-07 DIAGNOSIS — Z6841 Body Mass Index (BMI) 40.0 and over, adult: Secondary | ICD-10-CM

## 2016-12-07 DIAGNOSIS — R9431 Abnormal electrocardiogram [ECG] [EKG]: Secondary | ICD-10-CM

## 2016-12-07 DIAGNOSIS — IMO0001 Reserved for inherently not codable concepts without codable children: Secondary | ICD-10-CM

## 2016-12-07 NOTE — Patient Instructions (Addendum)
Testing/Procedures: Your physician has requested that you have an echocardiogram. Echocardiography is a painless test that uses sound waves to create images of your heart. It provides your doctor with information about the size and shape of your heart and how well your heart's chambers and valves are working. This procedure takes approximately one hour. There are no restrictions for this procedure.  Your physician has recommended that you wear a Zio monitor. Zio monitors are medical devices that record the heart's electrical activity. Doctors most often us these monitors to diagnose arrhythmias. Arrhythmias are problems with the speed or rhythm of the heartbeat. The monitor is a small, portable device. You can wear one while you do your normal daily activities. This is usually used to diagnose what is causing palpitations/syncope (passing out).    Follow-Up: Your physician recommends that you schedule a follow-up appointment as needed. We will call you with results and if needed schedule follow up at that time.    It was a pleasure seeing you today here in the office. Please do not hesitate to give us a call back if you have any further questions. [phone]  [place] CellarPamela A. RN, BSN     Echocardiogram An echocardiogram, or echocardiography, uses sound waves (ultrasound) to produce an image of your heart. The echocardiogram is simple, painless, obtained within a short period of time, and offers valuable information to your health care provider. The images from an echocardiogram can provide information such as:  Evidence of coronary artery disease (CAD).  Heart size.  Heart muscle function.  Heart valve function.  Aneurysm detection.  Evidence of a past heart attack.  Fluid buildup around the heart.  Heart muscle thickening.  Assess heart valve function. Tell a health care provider about:  Any allergies you have.  All medicines you are taking, including vitamins, herbs, eye drops,  creams, and over-the-counter medicines.  Any problems you or family members have had with anesthetic medicines.  Any blood disorders you have.  Any surgeries you have had.  Any medical conditions you have.  Whether you are pregnant or may be pregnant. What happens before the procedure? No special preparation is needed. Eat and drink normally. What happens during the procedure?  In order to produce an image of your heart, gel will be applied to your chest and a wand-like tool (transducer) will be moved over your chest. The gel will help transmit the sound waves from the transducer. The sound waves will harmlessly bounce off your heart to allow the heart images to be captured in real-time motion. These images will then be recorded.  You may need an IV to receive a medicine that improves the quality of the pictures. What happens after the procedure? You may return to your normal schedule including diet, activities, and medicines, unless your health care provider tells you otherwise. This information is not intended to replace advice given to you by your health care provider. Make sure you discuss any questions you have with your health care provider. Document Released: 09/01/2000 Document Revised: 04/22/2016 Document Reviewed: 05/12/2013 Elsevier Interactive Patient Education  2017 [employer]

## 2016-12-18 ENCOUNTER — Ambulatory Visit (INDEPENDENT_AMBULATORY_CARE_PROVIDER_SITE_OTHER): Payer: Managed Care, Other (non HMO) | Admitting: Family

## 2016-12-18 ENCOUNTER — Encounter: Payer: Self-pay | Admitting: Family

## 2016-12-18 VITALS — BP 132/96 | HR 94 | Temp 98.7°F | Ht 62.0 in | Wt 228.8 lb

## 2016-12-18 DIAGNOSIS — I10 Essential (primary) hypertension: Secondary | ICD-10-CM

## 2016-12-18 DIAGNOSIS — Z1231 Encounter for screening mammogram for malignant neoplasm of breast: Secondary | ICD-10-CM

## 2016-12-18 DIAGNOSIS — Z1239 Encounter for other screening for malignant neoplasm of breast: Secondary | ICD-10-CM

## 2016-12-18 DIAGNOSIS — Z803 Family history of malignant neoplasm of breast: Secondary | ICD-10-CM | POA: Insufficient documentation

## 2016-12-18 MED ORDER — AMLODIPINE BESYLATE 2.5 MG PO TABS: 5.0000 mg | ORAL_TABLET | Freq: Every day | ORAL | 1 refills | Status: DC

## 2016-12-18 NOTE — Assessment & Plan Note (Signed)
Ordered. Patient declined diagnostic mammogram and would like screening. Education provided on difference. Patient will call and schedule.

## 2016-12-18 NOTE — Progress Notes (Signed)
Pre visit review using our clinic review tool, if applicable. No additional management support is needed unless otherwise documented below in the visit note. 

## 2016-12-18 NOTE — Assessment & Plan Note (Signed)
Awaiting results of holter. DBP remains elevated. Trial increase of amlodipine with high and low parameters given to patient. Congratulated her on weight loss. Follow up 2 months and will consider beta blocker after results of holter.

## 2016-12-18 NOTE — Progress Notes (Signed)
Subjective:    Patient ID: Julie Alexander, female    DOB: 1980/12/06, 36 y.o.   MRN: [id number]  CC: Julie Alexander is a 36 y.o. female who presents today for follow up.   HPI: HTN- has seen Dr Alvino Chapel and completed Holter. Awaiting results. Will have echo.   Hasn't been taking BP at home. Doing well on amlodipine. Denies exertional chest pain or pressure, numbness or tingling radiating to left arm or jaw, palpitations, dizziness, frequent headaches, changes in vision, or shortness of breath.   Family h/o breast cancer which causes a lot of anxiety. Worries that she has breast cancer and knows 'that is what she will die from.' She occasionally feels breast tenderness generalized over left breast. This has been ongoing for several months. No gross asymmetry, discharge from nipples. Has had mammograms in the past however 'it has been a while.'      HISTORY:  Past Medical History:  Diagnosis Date  . Depression   . Emphysema of lung (HCC)   . Frequent headaches   . Heart murmur   . Hypertension   . Migraines   . PCOS (polycystic ovarian syndrome)   . Von Willebrand disease (HCC)    Past Surgical History:  Procedure Laterality Date  . ABDOMINAL HYSTERECTOMY     partial  . CHOLECYSTECTOMY  2011  . DILATION AND CURETTAGE OF UTERUS     Family History  Problem Relation Age of Onset  . Breast cancer Maternal Aunt 30    cancer  . Hypertension Mother   . Cancer Father     blood cancer ; unsure of name  . Heart attack Sister 41  . Heart attack Brother   . Ovarian cancer Neg Hx   . Colon cancer Neg Hx     Allergies: Patient has no known allergies. No current outpatient prescriptions on file prior to visit.   No current facility-administered medications on file prior to visit.     Social History  Substance Use Topics  . Smoking status: Never Smoker  . Smokeless tobacco: Never Used  . Alcohol use No    Review of Systems  Constitutional: Negative for chills and fever.    Respiratory: Negative for cough and chest tightness.   Cardiovascular: Negative for chest pain and palpitations.  Gastrointestinal: Negative for nausea and vomiting.      Objective:    BP (!) 132/96   Pulse 94   Temp 98.7 F (37.1 C) (Oral)   Ht  (1.575 m)   Wt 228 lb 12.8 oz (103.8 kg)   SpO2 97%   BMI 41.85 kg/m  BP Readings from Last 3 Encounters:  12/18/16 (!) 132/96  12/07/16 (!) 126/96  11/14/16 (!) 156/100   Wt Readings from Last 3 Encounters:  12/18/16 228 lb 12.8 oz (103.8 kg)  12/07/16 233 lb 8 oz (105.9 kg)  11/14/16 231 lb 3.2 oz (104.9 kg)    Physical Exam  Constitutional: She appears well-developed and well-nourished.  Eyes: Conjunctivae are normal.  Cardiovascular: Normal rate, regular rhythm, normal heart sounds and normal pulses.   Pulmonary/Chest: Effort normal and breath sounds normal. She has no wheezes. She has no rhonchi. She has no rales.  Neurological: She is alert.  Skin: Skin is warm and dry.  Psychiatric: She has a normal mood and affect. Her speech is normal and behavior is normal. Thought content normal.  Vitals reviewed.      Assessment & Plan:   Problem List Items  Addressed This Visit      Cardiovascular and Mediastinum   Hypertension - Primary    Awaiting results of holter. DBP remains elevated. Trial increase of amlodipine with high and low parameters given to patient. Congratulated her on weight loss. Follow up 2 months and will consider beta blocker after results of holter.       Relevant Medications   amLODipine (NORVASC) 2.5 MG tablet     Other   Screening for breast cancer    Ordered. Patient declined diagnostic mammogram and would like screening. Education provided on difference. Patient will call and schedule.       Relevant Orders   MM DIGITAL SCREENING BILATERAL       I have changed Ms. O'Brien's amLODipine.   Meds ordered this encounter  Medications  . amLODipine (NORVASC) 2.5 MG tablet    Sig: Take  2 tablets (5 mg total) by mouth daily.    Dispense:  120 tablet    Refill:  1    Order Specific Question:   Supervising Provider    Answer:   Sherlene Shams [[id number]]    Return precautions given.   Risks, benefits, and alternatives of the medications and treatment plan prescribed today were discussed, and patient expressed understanding.   Education regarding symptom management and diagnosis given to patient on AVS.  Continue to follow with Rennie Plowman, FNP for routine health maintenance.   Julie Alexander and I agreed with plan.   Rennie Plowman, FNP

## 2016-12-18 NOTE — Patient Instructions (Addendum)
Trial increase of amlodipine to   Monitor BP at home goal is < 130/90.     We placed a referral. Mammogram this year. I asked that you call one the below locations and schedule this when it is convenient for you.   If you have dense breasts, you may ask for 3D mammogram over the traditional 2D mammogram as new evidence suggest 3D is superior. Please note that NOT all insurance companies cover 3D and you may have to pay a higher copay. You may call your insurance company to further clarify your benefits.    [facility]  [address]  [place], [place]  [phone]  * Offers 3D mammogram if you ask*

## 2017-01-19 ENCOUNTER — Other Ambulatory Visit: Payer: Self-pay

## 2017-01-19 ENCOUNTER — Ambulatory Visit
Admission: RE | Admit: 2017-01-19 | Discharge: 2017-01-19 | Disposition: A | Discharge status: Home / Self Care | Payer: Managed Care, Other (non HMO) | Source: Ambulatory Visit | Attending: Family | Admitting: Family

## 2017-01-19 ENCOUNTER — Ambulatory Visit (INDEPENDENT_AMBULATORY_CARE_PROVIDER_SITE_OTHER): Payer: Managed Care, Other (non HMO)

## 2017-01-19 DIAGNOSIS — I10 Essential (primary) hypertension: Secondary | ICD-10-CM | POA: Diagnosis not present

## 2017-01-19 DIAGNOSIS — Z1231 Encounter for screening mammogram for malignant neoplasm of breast: Secondary | ICD-10-CM | POA: Insufficient documentation

## 2017-01-19 DIAGNOSIS — R9431 Abnormal electrocardiogram [ECG] [EKG]: Secondary | ICD-10-CM | POA: Diagnosis not present

## 2017-01-19 DIAGNOSIS — R002 Palpitations: Secondary | ICD-10-CM

## 2017-01-19 DIAGNOSIS — Z1239 Encounter for other screening for malignant neoplasm of breast: Secondary | ICD-10-CM

## 2017-01-23 ENCOUNTER — Encounter: Payer: Self-pay | Admitting: *Deleted

## 2017-01-29 ENCOUNTER — Encounter: Payer: Self-pay | Admitting: Family

## 2017-01-29 ENCOUNTER — Ambulatory Visit (INDEPENDENT_AMBULATORY_CARE_PROVIDER_SITE_OTHER): Payer: Managed Care, Other (non HMO) | Admitting: Family

## 2017-01-29 VITALS — BP 142/98 | HR 80 | Temp 98.4°F | Ht 62.0 in | Wt 229.4 lb

## 2017-01-29 DIAGNOSIS — I10 Essential (primary) hypertension: Secondary | ICD-10-CM

## 2017-01-29 DIAGNOSIS — Z803 Family history of malignant neoplasm of breast: Secondary | ICD-10-CM

## 2017-01-29 MED ORDER — AMLODIPINE BESYLATE 5 MG PO TABS: 5.0000 mg | ORAL_TABLET | Freq: Every day | ORAL | 1 refills | Status: DC

## 2017-01-29 NOTE — Patient Instructions (Signed)
Pleasure seeing you  Please return for physical   Managing Your Hypertension Hypertension is commonly called high blood pressure. This is when the force of your blood pressing against the walls of your arteries is too strong. Arteries are blood vessels that carry blood from your heart throughout your body. Hypertension forces the heart to work harder to pump blood, and may cause the arteries to become narrow or stiff. Having untreated or uncontrolled hypertension can cause heart attack, stroke, kidney disease, and other problems. What are blood pressure readings? A blood pressure reading consists of a higher number over a lower number. Ideally, your blood pressure should be below 120/80. The first ("top") number is called the systolic pressure. It is a measure of the pressure in your arteries as your heart beats. The second ("bottom") number is called the diastolic pressure. It is a measure of the pressure in your arteries as the heart relaxes. What does my blood pressure reading mean? Blood pressure is classified into four stages. Based on your blood pressure reading, your health care provider may use the following stages to determine what type of treatment you need, if any. Systolic pressure and diastolic pressure are measured in a unit called mm Hg. Normal   Systolic pressure: below 120.  Diastolic pressure: below 80. Elevated   Systolic pressure: 120-129.  Diastolic pressure: below 80. Hypertension stage 1     Diastolic pressure: 80-89. Hypertension stage 2   Systolic pressure: 140 or above.  Diastolic pressure: 90 or above. What health risks are associated with hypertension? Managing your hypertension is an important responsibility. Uncontrolled hypertension can lead to:  A heart attack.  A stroke.  A weakened blood vessel (aneurysm).  Heart failure.  Kidney damage.  Eye damage.  Metabolic syndrome.  Memory and concentration problems. What changes can I make to  manage my hypertension? Eating and drinking   Eat a diet that is high in fiber and potassium, and low in salt (sodium), added sugar, and fat. An example eating plan is called the DASH (Dietary Approaches to Stop Hypertension) diet. To eat this way:  Eat plenty of fresh fruits and vegetables. Try to fill half of your plate at each meal with fruits and vegetables.  Eat whole grains, such as whole wheat pasta, brown rice, or whole grain bread. Fill about one quarter of your plate with whole grains.  Eat low-fat diary products.  Avoid fatty cuts of meat, processed or cured meats, and poultry with skin. Fill about one quarter of your plate with lean proteins such as fish, chicken without skin, beans, eggs, and tofu.  Avoid premade and processed foods. These tend to be higher in sodium, added sugar, and fat.     Lifestyle   Work with your health care provider to maintain a healthy body weight, or to lose weight. Ask what an ideal weight is for you.  Get at least 30 minutes of exercise that causes your heart to beat faster (aerobic exercise) most days of the week. Activities may include walking, swimming, or biking.       Monitoring   Monitor your blood pressure at home as told by your health care provider. Your personal target blood pressure may vary depending on your medical conditions, your age, and other factors.  Have your blood pressure checked regularly, as often as told by your health care provider. Working with your health care provider   Review all the medicines you take with your health care provider because there may  be side effects or interactions.  Talk with your health care provider about your diet, exercise habits, and other lifestyle factors that may be contributing to hypertension.  Visit your health care provider regularly. Your health care provider can help you create and adjust your plan for managing hypertension. Will I need medicine to control my blood  pressure? Your health care provider may prescribe medicine if lifestyle changes are not enough to get your blood pressure under control, and if:  Your systolic blood pressure is 130 or higher.  Your diastolic blood pressure is 80 or higher. Take medicines only as told by your health care provider. Follow the directions carefully. Blood pressure medicines must be taken as prescribed. The medicine does not work as well when you skip doses. Skipping doses also puts you at risk for problems. Contact a health care provider if:  You think you are having a reaction to medicines you have taken.  You have repeated (recurrent) headaches.  You feel dizzy.  You have swelling in your ankles.  You have trouble with your vision. Get help right away if:  You develop a severe headache or confusion.  You have unusual weakness or numbness, or you feel faint.  You have severe pain in your chest or abdomen.  You vomit repeatedly.  You have trouble breathing. Summary  Hypertension is when the force of blood pumping through your arteries is too strong. If this condition is not controlled, it may put you at risk for serious complications.  Your personal target blood pressure may vary depending on your medical conditions, your age, and other factors. For most people, a normal blood pressure is less than 120/80.  Hypertension is managed by lifestyle changes, medicines, or both. Lifestyle changes include weight loss, eating a healthy, low-sodium diet, exercising more, and limiting alcohol. This information is not intended to replace advice given to you by your health care provider. Make sure you discuss any questions you have with your health care provider. Document Released: 05/29/2012 Document Revised: 08/02/2016 Document Reviewed: 08/02/2016 Elsevier Interactive Patient Education  2017 [employer]

## 2017-01-29 NOTE — Progress Notes (Signed)
Pre visit review using our clinic review tool, if applicable. No additional management support is needed unless otherwise documented below in the visit note. 

## 2017-01-29 NOTE — Progress Notes (Signed)
Subjective:    Patient ID: Julie Alexander, female    DOB: 12-27-1980, 36 y.o.   MRN: [id number]  CC: Julie Alexander is a 36 y.o. female who presents today for follow up.   HPI: HTN- Hasnt taken this morning 'ran out.' Has been doing well on 5mg  amlodipine.    Denies exertional chest pain or pressure, numbness or tingling radiating to left arm or jaw, palpitations, dizziness, frequent headaches, changes in vision, or shortness of breath.     Family h/o breast cancer- doesn't think it would be healthy for her 'mind' to go to genetics. Will let me know if she changes her mind.       HISTORY:  Past Medical History:  Diagnosis Date  . Depression   . Emphysema of lung (HCC)   . Frequent headaches   . Heart murmur   . Hypertension   . Migraines   . PCOS (polycystic ovarian syndrome)   . Von Willebrand disease (HCC)    Past Surgical History:  Procedure Laterality Date  . ABDOMINAL HYSTERECTOMY     partial; done due to PCOS  . CHOLECYSTECTOMY  2011  . DILATION AND CURETTAGE OF UTERUS     Family History  Problem Relation Age of Onset  . Breast cancer Maternal Aunt 30       cancer  . Hypertension Mother   . Cancer Father        blood cancer ; unsure of name  . Heart attack Sister 35  . Heart attack Brother   . Ovarian cancer Neg Hx   . Colon cancer Neg Hx     Allergies: Patient has no known allergies. No current outpatient prescriptions on file prior to visit.   No current facility-administered medications on file prior to visit.     Social History  Substance Use Topics  . Smoking status: Never Smoker  . Smokeless tobacco: Never Used  . Alcohol use No    Review of Systems  Constitutional: Negative for chills and fever.  Respiratory: Negative for cough.   Cardiovascular: Negative for chest pain and palpitations.  Gastrointestinal: Negative for nausea and vomiting.      Objective:    BP (!) 142/98   Pulse 80   Temp 98.4 F (36.9 C) (Oral)   Ht 5\' 2"   (1.575 m)   Wt 229 lb 6.4 oz (104.1 kg)   SpO2 97%   BMI 41.96 kg/m  BP Readings from Last 3 Encounters:  01/29/17 (!) 142/98  12/18/16 (!) 132/96  12/07/16 (!) 126/96   Wt Readings from Last 3 Encounters:  01/29/17 229 lb 6.4 oz (104.1 kg)  12/18/16 228 lb 12.8 oz (103.8 kg)  12/07/16 233 lb 8 oz (105.9 kg)    Physical Exam  Constitutional: She appears well-developed and well-nourished.  Eyes: Conjunctivae are normal.  Cardiovascular: Normal rate, regular rhythm, normal heart sounds and normal pulses.   Pulmonary/Chest: Effort normal and breath sounds normal. She has no wheezes. She has no rhonchi. She has no rales.  Neurological: She is alert.  Skin: Skin is warm and dry.  Psychiatric: She has a normal mood and affect. Her speech is normal and behavior is normal. Thought content normal.  Vitals reviewed.      Assessment & Plan:   Problem List Items Addressed This Visit      Cardiovascular and Mediastinum   Hypertension - Primary    Elevated today however has ran out of  Medication. Will continue 5mg  amlodipine.  F/u 3-6 months      Relevant Medications   amLODipine (NORVASC) 5 MG tablet     Other   Family history of breast cancer    Patient and I discussed genetics counseling and their role in assessing patient's lifetime Breast cancer risk. Patient politely declines seeing genetics, MRI breast.  She would like to continue with annual mammograms until age 89.           I have changed Ms. O'Brien's amLODipine.   Meds ordered this encounter  Medications  . amLODipine (NORVASC) 5 MG tablet    Sig: Take 1 tablet (5 mg total) by mouth daily.    Dispense:  90 tablet    Refill:  1    Order Specific Question:   Supervising Provider    Answer:   Sherlene ShamsULLO, TERESA L [[id number]]    Return precautions given.   Risks, benefits, and alternatives of the medications and treatment plan prescribed today were discussed, and patient expressed understanding.   Education  regarding symptom management and diagnosis given to patient on AVS.  Continue to follow with Allegra GranaArnett, Carriann Hesse G, FNP for routine health maintenance.   Julie ParadiseJaye A O'Brien and I agreed with plan.   Rennie PlowmanMargaret Mauricia Mertens, FNP

## 2017-01-29 NOTE — Assessment & Plan Note (Signed)
Patient and I discussed genetics counseling and their role in assessing patient's lifetime Breast cancer risk. Patient politely declines seeing genetics, MRI breast.  She would like to continue with annual mammograms until age 36.

## 2017-01-29 NOTE — Assessment & Plan Note (Signed)
Elevated today however has ran out of  Medication. Will continue 5mg  amlodipine. F/u 3-6 months

## 2017-04-30 ENCOUNTER — Ambulatory Visit: Payer: Managed Care, Other (non HMO) | Admitting: Family

## 2017-05-10 ENCOUNTER — Ambulatory Visit (INDEPENDENT_AMBULATORY_CARE_PROVIDER_SITE_OTHER): Payer: Managed Care, Other (non HMO) | Admitting: Family

## 2017-05-10 ENCOUNTER — Encounter: Payer: Self-pay | Admitting: Family

## 2017-05-10 VITALS — BP 166/108 | HR 84 | Temp 98.8°F | Ht 62.0 in | Wt 232.0 lb

## 2017-05-10 DIAGNOSIS — R519 Headache, unspecified: Secondary | ICD-10-CM | POA: Insufficient documentation

## 2017-05-10 DIAGNOSIS — I10 Essential (primary) hypertension: Secondary | ICD-10-CM

## 2017-05-10 DIAGNOSIS — R51 Headache: Secondary | ICD-10-CM | POA: Diagnosis not present

## 2017-05-10 LAB — BASIC METABOLIC PANEL
BUN: 12 mg/dL (ref 6–23)
CHLORIDE: 104 meq/L (ref 96–112)
CO2: 27 mEq/L (ref 19–32)
Calcium: 9.6 mg/dL (ref 8.4–10.5)
Creatinine, Ser: 0.86 mg/dL (ref 0.40–1.20)
GFR: 95.24 mL/min (ref >60.00)
Glucose, Bld: 114 mg/dL — ABNORMAL HIGH (ref 70–99)
Potassium: 3.7 mEq/L (ref 3.5–5.1)
SODIUM: 138 meq/L (ref 135–145)

## 2017-05-10 MED ORDER — AMLODIPINE BESYLATE 5 MG PO TABS: 5.0000 mg | ORAL_TABLET | Freq: Every day | ORAL | 1 refills | Status: AC

## 2017-05-10 NOTE — Progress Notes (Signed)
Subjective:    Patient ID: Julie Alexander, female    DOB: 06-Sep-1981, 36 y.o.   MRN: [id number]  CC: Julie Alexander is a 36 y.o. female who presents today for follow up.   HPI: HTN- not taking amlodipine as thought she was supposed to come off of medication. Denies exertional chest pain or pressure, numbness or tingling radiating to left arm or jaw, palpitations, dizziness,  changes in vision, or shortness of breath.   H/o 'ocular migraines' - NO HA today.  told by opthalmology many years ago. Triggered by bright lights. More recently thought it was allergies, and improved with allegra D.  Waking up with headache for past month, had been more frequent. Doesn't present in same location. 5-6 days/ week. Endorses nausea, vomiting. No double vision, numbness, tingling. Has aura occasionally with halo. Sleep and dark room will help- tyleonol PM helps. Tried topamax in past and stopped due to seeing commercials with side effects.       HISTORY:  Past Medical History:  Diagnosis Date  . Depression   . Emphysema of lung (HCC)   . Frequent headaches   . Heart murmur   . Hypertension   . Migraines   . PCOS (polycystic ovarian syndrome)   . Von Willebrand disease (HCC)    Past Surgical History:  Procedure Laterality Date  . ABDOMINAL HYSTERECTOMY     partial; done due to PCOS  . CHOLECYSTECTOMY  2011  . DILATION AND CURETTAGE OF UTERUS     Family History  Problem Relation Age of Onset  . Breast cancer Maternal Aunt 30       cancer  . Hypertension Mother   . Cancer Father        blood cancer ; unsure of name  . Heart attack Sister 86  . Heart attack Brother   . Ovarian cancer Neg Hx   . Colon cancer Neg Hx     Allergies: Patient has no known allergies. No current outpatient prescriptions on file prior to visit.   No current facility-administered medications on file prior to visit.     Social History  Substance Use Topics  . Smoking status: Never Smoker  . Smokeless  tobacco: Never Used  . Alcohol use No    Review of Systems  Constitutional: Negative for chills and fever.  Eyes: Negative for visual disturbance.  Respiratory: Negative for cough.   Cardiovascular: Negative for chest pain and palpitations.  Gastrointestinal: Negative for nausea and vomiting.  Neurological: Positive for headaches. Negative for dizziness and weakness.      Objective:    BP (!) 166/108   Pulse 84   Temp 98.8 F (37.1 C) (Oral)   Ht 5\' 2"  (1.575 m)   Wt 232 lb (105.2 kg)   SpO2 94%   BMI 42.43 kg/m  BP Readings from Last 3 Encounters:  05/10/17 (!) 166/108  01/29/17 (!) 142/98  12/18/16 (!) 132/96   Wt Readings from Last 3 Encounters:  05/10/17 232 lb (105.2 kg)  01/29/17 229 lb 6.4 oz (104.1 kg)  12/18/16 228 lb 12.8 oz (103.8 kg)    Physical Exam  Constitutional: She appears well-developed and well-nourished.  HENT:  Mouth/Throat: Uvula is midline, oropharynx is clear and moist and mucous membranes are normal.  Eyes: Pupils are equal, round, and reactive to light. Conjunctivae and EOM are normal.  Fundus normal bilaterally.   Cardiovascular: Normal rate, regular rhythm, normal heart sounds and normal pulses.   Pulmonary/Chest: Effort normal  and breath sounds normal. She has no wheezes. She has no rhonchi. She has no rales.  Neurological: She is alert. She has normal strength. No cranial nerve deficit or sensory deficit. She displays a negative Romberg sign.  Reflex Scores:      Bicep reflexes are 2+ on the right side and 2+ on the left side.      Patellar reflexes are 2+ on the right side and 2+ on the left side. Grip equal and strong bilateral upper extremities. Gait strong and steady. Able to perform rapid alternating movement without difficulty.   Skin: Skin is warm and dry.  Psychiatric: She has a normal mood and affect. Her speech is normal and behavior is normal. Thought content normal.  Vitals reviewed.      Assessment & Plan:   Problem  List Items Addressed This Visit      Cardiovascular and Mediastinum   Hypertension - Primary    Elevated. Rare pvcs seen on holter. Will maintain on amlodpine. There had  Been misunderstanding regarding whether staying on medication. Advised patient to continue amlodipine which she agrees. May also be contributing to headache. Follow up 3 months. Labs today.       Relevant Medications   amLODipine (NORVASC) 5 MG tablet   Other Relevant Orders   Basic metabolic panel     Other   Headache    Reassured by normal neurologic exam today. Headaches are more controlled since patient started Allegra-D. Very much triggered by bright lights. Advised patient since frequency has increased, to have a brain MRI. She politely declines at this time and would like to further monitor triggers which I think is appropriate. Education provided regarding if symptoms were to worsen or headache become more more severe, more frequent so we can pursue imaging and ppx treatment that point.      Relevant Medications   amLODipine (NORVASC) 5 MG tablet       I am having Ms. O'Brien maintain her amLODipine.   Meds ordered this encounter  Medications  . amLODipine (NORVASC) 5 MG tablet    Sig: Take 1 tablet (5 mg total) by mouth daily.    Dispense:  90 tablet    Refill:  1    Order Specific Question:   Supervising Provider    Answer:   Sherlene Shams [[id number]]    Return precautions given.   Risks, benefits, and alternatives of the medications and treatment plan prescribed today were discussed, and patient expressed understanding.   Education regarding symptom management and diagnosis given to patient on AVS.  Continue to follow with Allegra Grana, FNP for routine health maintenance.   Julie Alexander and I agreed with plan.   Rennie Plowman, FNP

## 2017-05-10 NOTE — Assessment & Plan Note (Signed)
Reassured by normal neurologic exam today. Headaches are more controlled since patient started Allegra-D. Very much triggered by bright lights. Advised patient since frequency has increased, to have a brain MRI. She politely declines at this time and would like to further monitor triggers which I think is appropriate. Education provided regarding if symptoms were to worsen or headache become more more severe, more frequent so we can pursue imaging and ppx treatment that point.

## 2017-05-10 NOTE — Patient Instructions (Addendum)
Amlodipine 5mg  dose- take once per day  Monitor blood pressure once a week, goal < 130/80.  As discussed, monitor headaches and if more frequent or more severe, please let me know immediately.   Follow up 3 months   Managing Your Hypertension Hypertension is commonly called high blood pressure. This is when the force of your blood pressing against the walls of your arteries is too strong. Arteries are blood vessels that carry blood from your heart throughout your body. Hypertension forces the heart to work harder to pump blood, and may cause the arteries to become narrow or stiff. Having untreated or uncontrolled hypertension can cause heart attack, stroke, kidney disease, and other problems. What are blood pressure readings? A blood pressure reading consists of a higher number over a lower number. Ideally, your blood pressure should be below 120/80. The first ("top") number is called the systolic pressure. It is a measure of the pressure in your arteries as your heart beats. The second ("bottom") number is called the diastolic pressure. It is a measure of the pressure in your arteries as the heart relaxes. What does my blood pressure reading mean? Blood pressure is classified into four stages. Based on your blood pressure reading, your health care provider may use the following stages to determine what type of treatment you need, if any. Systolic pressure and diastolic pressure are measured in a unit called mm Hg. Normal  Systolic pressure: below 120.  Diastolic pressure: below 80. Elevated  Systolic pressure: 120-129.  Diastolic pressure: below 80. Hypertension stage 1  Systolic pressure: 130-139.  Diastolic pressure: 80-89. Hypertension stage 2  Systolic pressure: 140 or above.  Diastolic pressure: 90 or above. What health risks are associated with hypertension? Managing your hypertension is an important responsibility. Uncontrolled hypertension can lead to:  A heart  attack.  A stroke.  A weakened blood vessel (aneurysm).  Heart failure.  Kidney damage.  Eye damage.  Metabolic syndrome.  Memory and concentration problems.  What changes can I make to manage my hypertension? Hypertension can be managed by making lifestyle changes and possibly by taking medicines. Your health care provider will help you make a plan to bring your blood pressure within a normal range. Eating and drinking  Eat a diet that is high in fiber and potassium, and low in salt (sodium), added sugar, and fat. An example eating plan is called the DASH (Dietary Approaches to Stop Hypertension) diet. To eat this way: ? Eat plenty of fresh fruits and vegetables. Try to fill half of your plate at each meal with fruits and vegetables. ? Eat whole grains, such as whole wheat pasta, brown rice, or whole grain bread. Fill about one quarter of your plate with whole grains. ? Eat low-fat diary products. ? Avoid fatty cuts of meat, processed or cured meats, and poultry with skin. Fill about one quarter of your plate with lean proteins such as fish, chicken without skin, beans, eggs, and tofu. ? Avoid premade and processed foods. These tend to be higher in sodium, added sugar, and fat.  Reduce your daily sodium intake. Most people with hypertension should eat less than 1,500 mg of sodium a day.  Limit alcohol intake to no more than 1 drink a day for nonpregnant women and 2 drinks a day for men. One drink equals 12 oz of beer, 5 oz of wine, or 1 oz of hard liquor. Lifestyle  Work with your health care provider to maintain a healthy body weight, or to  lose weight. Ask what an ideal weight is for you.  Get at least 30 minutes of exercise that causes your heart to beat faster (aerobic exercise) most days of the week. Activities may include walking, swimming, or biking.  Include exercise to strengthen your muscles (resistance exercise), such as weight lifting, as part of your weekly exercise  routine. Try to do these types of exercises for 30 minutes at least 3 days a week.  Do not use any products that contain nicotine or tobacco, such as cigarettes and e-cigarettes. If you need help quitting, ask your health care provider.  Control any long-term (chronic) conditions you have, such as high cholesterol or diabetes. Monitoring  Monitor your blood pressure at home as told by your health care provider. Your personal target blood pressure may vary depending on your medical conditions, your age, and other factors.  Have your blood pressure checked regularly, as often as told by your health care provider. Working with your health care provider  Review all the medicines you take with your health care provider because there may be side effects or interactions.  Talk with your health care provider about your diet, exercise habits, and other lifestyle factors that may be contributing to hypertension.  Visit your health care provider regularly. Your health care provider can help you create and adjust your plan for managing hypertension. Will I need medicine to control my blood pressure? Your health care provider may prescribe medicine if lifestyle changes are not enough to get your blood pressure under control, and if:  Your systolic blood pressure is 130 or higher.  Your diastolic blood pressure is 80 or higher.  Take medicines only as told by your health care provider. Follow the directions carefully. Blood pressure medicines must be taken as prescribed. The medicine does not work as well when you skip doses. Skipping doses also puts you at risk for problems. Contact a health care provider if:  You think you are having a reaction to medicines you have taken.  You have repeated (recurrent) headaches.  You feel dizzy.  You have swelling in your ankles.  You have trouble with your vision. Get help right away if:  You develop a severe headache or confusion.  You have unusual  weakness or numbness, or you feel faint.  You have severe pain in your chest or abdomen.  You vomit repeatedly.  You have trouble breathing. Summary  Hypertension is when the force of blood pumping through your arteries is too strong. If this condition is not controlled, it may put you at risk for serious complications.  Your personal target blood pressure may vary depending on your medical conditions, your age, and other factors. For most people, a normal blood pressure is less than 120/80.  Hypertension is managed by lifestyle changes, medicines, or both. Lifestyle changes include weight loss, eating a healthy, low-sodium diet, exercising more, and limiting alcohol. This information is not intended to replace advice given to you by your health care provider. Make sure you discuss any questions you have with your health care provider. Document Released: 05/29/2012 Document Revised: 08/02/2016 Document Reviewed: 08/02/2016 Elsevier Interactive Patient Education  [employer].

## 2017-05-10 NOTE — Progress Notes (Signed)
Pre visit review using our clinic review tool, if applicable. No additional management support is needed unless otherwise documented below in the visit note. 

## 2017-05-10 NOTE — Assessment & Plan Note (Signed)
Elevated. Rare pvcs seen on holter. Will maintain on amlodpine. There had  Been misunderstanding regarding whether staying on medication. Advised patient to continue amlodipine which she agrees. May also be contributing to headache. Follow up 3 months. Labs today.

## 2017-12-03 ENCOUNTER — Telehealth: Payer: Self-pay

## 2017-12-03 NOTE — Telephone Encounter (Signed)
Copied from CRM [id number]. Topic: Appointment Scheduling - Scheduling Inquiry for Clinic >> Dec 03, 2017  2:36 PM Oneal GroutSebastian, Jennifer S wrote: Reason for CRM: Growth on head, about the size of tic tac, fell off last month, has came back. Has a new mole on face as well. Very concerned, would like to see Rennie PlowmanMargaret Arnett this week if possible, please advise

## 2017-12-04 NOTE — Telephone Encounter (Signed)
Only appointment available is March 22nd acute slot

## 2017-12-07 NOTE — Telephone Encounter (Signed)
I misunderstood- thought we made that appt  Yes, she can have an acute- with me or colleague since I know she is worried.

## 2017-12-07 NOTE — Telephone Encounter (Signed)
Tried calling patient voicemail box full , unable to leave message

## 2018-01-14 ENCOUNTER — Ambulatory Visit: Payer: Managed Care, Other (non HMO) | Admitting: Family

## 2018-02-28 ENCOUNTER — Encounter: Payer: Self-pay | Admitting: Family Medicine

## 2018-02-28 ENCOUNTER — Ambulatory Visit: Payer: Managed Care, Other (non HMO) | Admitting: Family Medicine

## 2018-02-28 VITALS — BP 132/88 | HR 104 | Temp 99.0°F | Wt 229.1 lb

## 2018-02-28 DIAGNOSIS — J029 Acute pharyngitis, unspecified: Secondary | ICD-10-CM | POA: Diagnosis not present

## 2018-02-28 DIAGNOSIS — R05 Cough: Secondary | ICD-10-CM | POA: Diagnosis not present

## 2018-02-28 DIAGNOSIS — R059 Cough, unspecified: Secondary | ICD-10-CM

## 2018-02-28 DIAGNOSIS — H66002 Acute suppurative otitis media without spontaneous rupture of ear drum, left ear: Secondary | ICD-10-CM

## 2018-02-28 LAB — POCT RAPID STREP A (OFFICE): Rapid Strep A Screen: NEGATIVE

## 2018-02-28 MED ORDER — HYDROCODONE-HOMATROPINE 5-1.5 MG/5ML PO SYRP: 5.0000 mL | ORAL_SOLUTION | Freq: Three times a day (TID) | ORAL | 0 refills | Status: DC | PRN

## 2018-02-28 MED ORDER — AMOXICILLIN-POT CLAVULANATE 875-125 MG PO TABS: 1.0000 | ORAL_TABLET | Freq: Two times a day (BID) | ORAL | 0 refills | Status: DC

## 2018-02-28 NOTE — Patient Instructions (Signed)
Please drink plenty of water enough for your urine to be pale yellow or clear. You may use Tylenol 325 mg every 6 hours or ibuprofen 2 to 3 tablets every 6 hours as needed. Mucinex DM can be used for cough. Follow-up for evaluation if your symptoms do not improve in 3-4 days, worsen, or you develop a fever greater than 100.   Warm, salt water gargles are also good for symptom improvement.   Otitis Media, Adult Otitis media is redness, soreness, and puffiness (swelling) in the space just behind your eardrum (middle ear). It may be caused by allergies or infection. It often happens along with a cold. Follow these instructions at home:  Take your medicine as told. Finish it even if you start to feel better.  Only take over-the-counter or prescription medicines for pain, discomfort, or fever as told by your doctor.  Follow up with your doctor as told. Contact a doctor if:  You have otitis media only in one ear, or bleeding from your nose, or both.  You notice a lump on your neck.  You are not getting better in 3-5 days.  You feel worse instead of better. Get help right away if:  You have pain that is not helped with medicine.  You have puffiness, redness, or pain around your ear.  You get a stiff neck.  You cannot move part of your face (paralysis).  You notice that the bone behind your ear hurts when you touch it. This information is not intended to replace advice given to you by your health care provider. Make sure you discuss any questions you have with your health care provider. Document Released: 02/21/2008 Document Revised: 02/10/2016 Document Reviewed: 04/01/2013 Elsevier Interactive Patient Education  2017 [employer]

## 2018-02-28 NOTE — Progress Notes (Addendum)
Patient ID: Julie Alexander, female   DOB: [birth year], 37 y.o.   MRN: [id number]  PCP: Allegra Grana, FNP  Subjective:   Julie Alexander is a 37 y.o. year old very pleasant female patient who presents with symptoms including nasal congestion, sore throat, left ear pain, body aches, fever with Tmax of 101 F per patient,  cough that is minimally productive of yellow mucous Associated post nasal drip and  nausea with one episode of vomiting after coughing. Cough can wake patient at night. -started: 5 days ago symptoms are not improving, ear pain and cough are most bothersome symptom. -previous treatments: Allegra D and acetaminophen pm have provided limited benefit -sick contacts/travel/risks: denies flu exposure. No recent sick contact exposure No recent antibiotic use No history of asthma or COPD She is not a a smoker  -Hx of: seasonal allergies  ROS-denies SOB,  tooth pain  Pertinent Past Medical History- HTN  Past Medical History:  Diagnosis Date  . Depression   . Emphysema of lung (HCC)   . Frequent headaches   . Heart murmur   . Hypertension   . Migraines   . PCOS (polycystic ovarian syndrome)   . Von Willebrand disease (HCC)      Social History   Socioeconomic History  . Marital status: Married    Spouse name: Not on file  . Number of children: Not on file  . Years of education: Not on file  . Highest education level: Not on file  Occupational History  . Not on file  Social Needs  . Financial resource strain: Not on file  . Food insecurity:    Worry: Not on file    Inability: Not on file  . Transportation needs:    Medical: Not on file    Non-medical: Not on file  Tobacco Use  . Smoking status: Never Smoker  . Smokeless tobacco: Never Used  Substance and Sexual Activity  . Alcohol use: No  . Drug use: No  . Sexual activity: Not on file  Lifestyle  . Physical activity:    Days per week: Not on file    Minutes per session: Not on file  . Stress: Not on  file  Relationships  . Social connections:    Talks on phone: Not on file    Gets together: Not on file    Attends religious service: Not on file    Active member of club or organization: Not on file    Attends meetings of clubs or organizations: Not on file    Relationship status: Not on file  . Intimate partner violence:    Fear of current or ex partner: Not on file    Emotionally abused: Not on file    Physically abused: Not on file    Forced sexual activity: Not on file  Other Topics Concern  . Not on file  Social History Narrative   From [place]   Has daughter with autism- 93 years old.   Married 2018- husband is alcoholic.       Past Surgical History:  Procedure Laterality Date  . ABDOMINAL HYSTERECTOMY     partial; done due to PCOS  . CHOLECYSTECTOMY  2011  . DILATION AND CURETTAGE OF UTERUS      Family History  Problem Relation Age of Onset  . Breast cancer Maternal Aunt 30       cancer  . Hypertension Mother   . Cancer Father  blood cancer ; unsure of name  . Heart attack Sister [id number]  . Heart attack Brother   . Ovarian cancer Neg Hx   . Colon cancer Neg Hx     No Known Allergies  Current Outpatient Medications on File Prior to Visit  Medication Sig Dispense Refill  . amLODipine (NORVASC) 5 MG tablet Take 1 tablet (5 mg total) by mouth daily. 90 tablet 1   No current facility-administered medications on file prior to visit.     BP 132/88 (BP Location: Left Arm, Patient Position: Sitting, Cuff Size: Large)   Pulse (!) 104   Temp 99 F (37.2 C) (Oral)   Wt 229 lb 2 oz (103.9 kg)   SpO2 96%   BMI 41.91 kg/m  Retake of HR: 99   Medications- reviewed  Current Outpatient Medications  Medication Sig Dispense Refill  . amLODipine (NORVASC) 5 MG tablet Take 1 tablet (5 mg total) by mouth daily. 90 tablet 1   No current facility-administered medications for this visit.     Objective: Gen: NAD, coughing during exam HEENT: Turbinates  erythematous, Right TM normal, Left TM erythematous and bulging, pharynx mildly erythematous with no exudate or edema present, no sinus tenderness present CV: RRR no murmurs rubs or gallops Lungs: CTAB no crackles, wheeze, rhonchi Abdomen: soft/nontender/nondistended/normal bowel sounds. No rebound or guarding.  Ext: no edema Skin: warm, dry, no rash Neuro: grossly normal, moves all extremities  Assessment/Plan: 1. Acute suppurative otitis media of left ear without spontaneous rupture of tympanic membrane, recurrence not specified Symptoms and exam are consistent with AOM; will treat with Augmentin.  Advised patient on supportive measures:  Get rest, drink plenty of fluids, and use tylenol as needed for discomfort.  Follow up if fever >101, if symptoms worsen or if symptoms are not improved in 3 to 4  days. Patient verbalizes understanding.   - amoxicillin-clavulanate (AUGMENTIN) 875-125 MG tablet; Take 1 tablet by mouth 2 (two) times daily.  Dispense: 14 tablet; Refill: 0  2. Pharyngitis, unspecified etiology Rapid strep negative. Warm salt water gargles; acetaminophen for discomfort.  - POCT rapid strep A  3. Cough Mucinex DM can be used for cough or hycodan can be used at night if needed. Cough is aggravated by post nasal drip. Benign lung exam is reassuring today, no SOB present. Retake of HR 99 and O2 sat: 98%.Cough wakes patient at night; hycodan provided  and advised follow up if symptoms do not improve with treatment of AOM, symptoms worsen, or new symptoms appear particular SOB. - HYDROcodone-homatropine (HYCODAN) 5-1.5 MG/5ML syrup; Take 5 mLs by mouth every 8 (eight) hours as needed for cough.  Dispense: 120 mL; Refill: 0  We discussed that findings do not suggest pneumonia or strep throat. We discussed worsening symptoms of fever >101 and  shortness of breath.   Finally, we reviewed reasons to return to care including if symptoms worsen or persist or new concerns arise- once again  particularly shortness of breath or fever >101. Return precautions provided.  Work note provided to patient.  Inez CatalinaJulia Ann Kalianne Fetting, FNP

## 2018-11-14 ENCOUNTER — Encounter: Payer: Self-pay | Admitting: Family Medicine

## 2018-11-14 ENCOUNTER — Ambulatory Visit: Payer: Managed Care, Other (non HMO) | Admitting: Family Medicine

## 2018-11-14 VITALS — BP 150/90 | HR 84 | Temp 98.4°F | Resp 16 | Ht 62.5 in

## 2018-11-14 DIAGNOSIS — H6693 Otitis media, unspecified, bilateral: Secondary | ICD-10-CM | POA: Diagnosis not present

## 2018-11-14 DIAGNOSIS — J029 Acute pharyngitis, unspecified: Secondary | ICD-10-CM | POA: Diagnosis not present

## 2018-11-14 LAB — POC INFLUENZA A&B (BINAX/QUICKVUE)
INFLUENZA A, POC: NEGATIVE
Influenza B, POC: NEGATIVE

## 2018-11-14 LAB — POCT RAPID STREP A (OFFICE): RAPID STREP A SCREEN: NEGATIVE

## 2018-11-14 MED ORDER — AMOXICILLIN-POT CLAVULANATE 875-125 MG PO TABS: 1.0000 | ORAL_TABLET | Freq: Two times a day (BID) | ORAL | 0 refills | Status: DC

## 2018-11-14 NOTE — Progress Notes (Signed)
Subjective:    Patient ID: Julie Alexander, female    DOB: [birth year], 38 y.o.   MRN: [id number]  HPI   Patient presents to clinic complaining of sore throat and bilateral ear pain that began yesterday.  Patient states her ear began to ache slightly about 3 days ago, but became painful last night.  States her throat feels as if it is on fire and if "razor blades are back there"  Denies fever or chills.  Denies body aches.  Denies nausea/vomiting or diarrhea.  Patient Active Problem List   Diagnosis Date Noted  . Headache 05/10/2017  . Family history of breast cancer 12/18/2016  . Anxiety 11/14/2016  . Hypertension 11/14/2016  . Palpitations 11/14/2016   Social History   Tobacco Use  . Smoking status: Never Smoker  . Smokeless tobacco: Never Used  Substance Use Topics  . Alcohol use: No   Review of Systems   Constitutional: Negative for chills, fatigue and fever.  HENT: +bilat ear pain and sore throat Eyes: Negative.   Respiratory: Negative for cough, shortness of breath and wheezing.   Cardiovascular: Negative for chest pain, palpitations and leg swelling.  Gastrointestinal: Negative for abdominal pain, diarrhea, nausea and vomiting.  Genitourinary: Negative for dysuria, frequency and urgency.  Musculoskeletal: Negative for arthralgias and myalgias.  Skin: Negative for color change, pallor and rash.  Neurological: Negative for syncope, light-headedness and headaches.  Psychiatric/Behavioral: The patient is not nervous/anxious.       Objective:   Physical Exam Vitals signs and nursing note reviewed.  Constitutional:      General: She is not in acute distress.    Appearance: She is not toxic-appearing.  HENT:     Head: Normocephalic and atraumatic.     Right Ear: Tenderness present. A middle ear effusion is present. Tympanic membrane is erythematous.     Left Ear: Tenderness present. A middle ear effusion is present. Tympanic membrane is erythematous.     Nose:  Rhinorrhea present.     Mouth/Throat:     Mouth: Mucous membranes are moist.     Pharynx: Posterior oropharyngeal erythema (+post nasal drip) present. No oropharyngeal exudate or uvula swelling.     Tonsils: No tonsillar exudate or tonsillar abscesses.  Eyes:     Conjunctiva/sclera: Conjunctivae normal.     Pupils: Pupils are equal, round, and reactive to light.  Neck:     Musculoskeletal: Normal range of motion and neck supple.  Cardiovascular:     Rate and Rhythm: Normal rate and regular rhythm.  Pulmonary:     Effort: Pulmonary effort is normal. No respiratory distress.     Breath sounds: Normal breath sounds. No wheezing, rhonchi or rales.  Skin:    General: Skin is warm and dry.     Coloration: Skin is not pale.  Neurological:     Mental Status: She is alert and oriented to person, place, and time.  Psychiatric:        Mood and Affect: Mood normal.        Behavior: Behavior normal.    Vitals:   11/14/18 1518  BP: (!) 150/90  Pulse: 84  Resp: 16  Temp: 98.4 F (36.9 C)  SpO2: 98%       Assessment & Plan:   Acute otitis media bilaterally-patient will take Augmentin twice daily to treat ear infection.  Advised to alternate Tylenol and Motrin to help reduce pain.  Also advised to keep up good fluid intake, get plenty of rest  and do good handwashing.  Sore throat-point-of-care strep and flu swabs are negative in clinic.  Patient advised to do salt water gargles and suck on cough drops to help soothe sore throat.  Advised patient to keep regularly scheduled follow-up with PCP as planned.  Advised to return to clinic sooner if current symptoms persist or worsen or if new issues arise.

## 2018-11-14 NOTE — Patient Instructions (Signed)
Use ibuprofen to help sore throat  Also can do salt water gargles and cough drops to soothe throat

## 2018-11-28 ENCOUNTER — Other Ambulatory Visit: Payer: Self-pay

## 2018-11-28 ENCOUNTER — Encounter: Payer: Self-pay | Admitting: Family Medicine

## 2018-11-28 ENCOUNTER — Ambulatory Visit: Payer: 59 | Admitting: Family Medicine

## 2018-11-28 VITALS — BP 148/80 | HR 94 | Temp 98.8°F | Resp 18 | Ht 62.0 in

## 2018-11-28 DIAGNOSIS — R519 Headache, unspecified: Secondary | ICD-10-CM

## 2018-11-28 DIAGNOSIS — J312 Chronic pharyngitis: Secondary | ICD-10-CM | POA: Diagnosis not present

## 2018-11-28 DIAGNOSIS — R51 Headache: Secondary | ICD-10-CM

## 2018-11-28 MED ORDER — METHYLPREDNISOLONE 4 MG PO TBPK: ORAL_TABLET | ORAL | 0 refills | Status: DC

## 2018-11-28 NOTE — Progress Notes (Signed)
Subjective:    Patient ID: Julie Alexander, female    DOB: [birth year], 38 y.o.   MRN: [id number]  HPI   Patient presents to clinic complaining of sore throat and headache that began 2-3 weeks ago.  Patient finished Augmentin course that was prescribed beginning on 11/14/2018 for acute otitis media.  States ears do feel somewhat better.  Patient states she is concerned about her throat now due to learning that her boss ended up in the hospital with a rare bacterial throat infection. She was exposed to her boss 2 weeks ago.  Patient has been trying to keep self healthy by washing her hands, drinking lots of fluids, has been taking Allegra-D and ibuprofen without much effect in helping symptoms.  Patient Active Problem List   Diagnosis Date Noted  . Headache 05/10/2017  . Family history of breast cancer 12/18/2016  . Anxiety 11/14/2016  . Hypertension 11/14/2016  . Palpitations 11/14/2016   Social History   Tobacco Use  . Smoking status: Never Smoker  . Smokeless tobacco: Never Used  Substance Use Topics  . Alcohol use: No   Review of Systems   Constitutional: Negative for chills, fatigue and fever.  HENT: Negative for congestion, ear pain, sinus pain. +sore throat.   Eyes: Negative.   Respiratory: Negative for cough, shortness of breath and wheezing.   Cardiovascular: Negative for chest pain, palpitations and leg swelling.  Gastrointestinal: Negative for abdominal pain, diarrhea, nausea and vomiting.  Genitourinary: Negative for dysuria, frequency and urgency.  Musculoskeletal: Negative for arthralgias and myalgias.  Skin: Negative for color change, pallor and rash.  Neurological: Negative for syncope, light-headedness. +headache.  Psychiatric/Behavioral: The patient is not nervous/anxious.       Objective:   Physical Exam Vitals signs and nursing note reviewed.  Constitutional:      General: She is not in acute distress.    Appearance: She is not toxic-appearing or  diaphoretic.  HENT:     Head: Normocephalic and atraumatic.     Ears:     Comments: Mild fullness of bilat TMs    Mouth/Throat:     Mouth: Mucous membranes are moist.     Pharynx: Uvula midline. Posterior oropharyngeal erythema (mild redness, post nasal drip) present. No uvula swelling.     Tonsils: No tonsillar exudate or tonsillar abscesses. Swelling: 0 on the right. 0 on the left.  Eyes:     Conjunctiva/sclera: Conjunctivae normal.     Pupils: Pupils are equal, round, and reactive to light.  Neck:     Musculoskeletal: Normal range of motion and neck supple.  Cardiovascular:     Rate and Rhythm: Normal rate and regular rhythm.     Heart sounds: Normal heart sounds.  Pulmonary:     Effort: Pulmonary effort is normal. No respiratory distress.     Breath sounds: Normal breath sounds. No wheezing, rhonchi or rales.  Lymphadenopathy:     Cervical: No cervical adenopathy.  Skin:    General: Skin is warm and dry.     Coloration: Skin is not pale.  Neurological:     Mental Status: She is alert and oriented to person, place, and time.  Psychiatric:        Behavior: Behavior normal.    Vitals:   11/28/18 1414  BP: (!) 148/80  Pulse: 94  Resp: 18  Temp: 98.8 F (37.1 C)  SpO2: 97%      Assessment & Plan:    A total of 25  minutes  were spent face-to-face with the patient during this encounter and over half of that time was spent on counseling and coordination of care. The patient was counseled on illness prevention, good handwashing, keeping self well, drinking lots of water, getting good rest; reassurance that she recently took a strong antibiotic and throat does not appear to have streptococcal infection.  Sore throat- unclear reason for patient's sore throat.  Appears it could be related to postnasal drainage/allergy symptoms.  We have collected a throat culture to be sent to lab for testing.  Patient's throat does not appear to look like a strep throat, so I decided not to  swab for strep as patient has no strep exposure, has not been around small children and was swabbed at last visit and treated with Augmentin which would have covered a strep throat.  Patient given a short burst of steroids to help reduce inflammation and pain.  Also advised she can use Tylenol as needed.  Encouraged to do salt water gargles, use Chloraseptic throat spray, get plenty of rest, do good handwashing.  Reassured patient that I do not see abscess in back of throat, no exudates on tonsils.  Due to persistence of patient's symptoms, we will do ENT referral

## 2018-11-30 LAB — CULTURE, UPPER RESPIRATORY
MICRO NUMBER: 311768
SPECIMEN QUALITY: ADEQUATE

## 2019-10-22 ENCOUNTER — Other Ambulatory Visit: Payer: Self-pay | Admitting: Family

## 2019-11-24 ENCOUNTER — Encounter: Payer: Self-pay | Admitting: Family

## 2019-11-24 ENCOUNTER — Telehealth: Payer: Self-pay | Admitting: Family

## 2019-11-24 ENCOUNTER — Other Ambulatory Visit: Payer: Self-pay

## 2019-11-24 ENCOUNTER — Ambulatory Visit (INDEPENDENT_AMBULATORY_CARE_PROVIDER_SITE_OTHER): Payer: 59 | Admitting: Family

## 2019-11-24 VITALS — BP 140/84 | HR 78 | Temp 97.3°F | Ht 62.95 in | Wt 219.2 lb

## 2019-11-24 DIAGNOSIS — Z0000 Encounter for general adult medical examination without abnormal findings: Secondary | ICD-10-CM | POA: Diagnosis not present

## 2019-11-24 DIAGNOSIS — I10 Essential (primary) hypertension: Secondary | ICD-10-CM | POA: Diagnosis not present

## 2019-11-24 DIAGNOSIS — Z23 Encounter for immunization: Secondary | ICD-10-CM

## 2019-11-24 NOTE — Assessment & Plan Note (Addendum)
Declines pelvic exam to determine if she has a cervix. Would like to call previous OB GYN, Dr Alphonzo Lemmings.  She is aware that if she has a cervix, then she would require pelvic exam with Pap smear.  She will schedule her mammogram at [facility], this has been ordered.  Patient politely declines clinical breast exam in the office today.

## 2019-11-24 NOTE — Telephone Encounter (Signed)
Lvm for pt to call back and schedule 3 month follow up. I was told after I called her pt checked out and didn't want to schedule at this time b/c she may be moving.

## 2019-11-24 NOTE — Telephone Encounter (Signed)
FYI

## 2019-11-24 NOTE — Telephone Encounter (Signed)
Pt called to let Claris Che know that she has had her cervix removed

## 2019-11-24 NOTE — Patient Instructions (Addendum)
Monitor blood pressure,  Goal is less than 120/80, based on newest guidelines; if persistently higher, please make sooner follow up appointment so we can recheck you blood pressure and start back amlodipine.   Please call call and schedule your 3D mammogram as discussed.   [facility]  [address]  [place], [place]  [phone]  Please speak with your OB GYN, Dr Alphonzo Lemmings, as it regards to cervical cancer screening; again , if you have a cervix, you need to have pap smear to screen for cervical cancer  Stay safe!   Health Maintenance, Female Adopting a healthy lifestyle and getting preventive care are important in promoting health and wellness. Ask your health care provider about:  The right schedule for you to have regular tests and exams.  Things you can do on your own to prevent diseases and keep yourself healthy. What should I know about diet, weight, and exercise? Eat a healthy diet   Eat a diet that includes plenty of vegetables, fruits, low-fat dairy products, and lean protein.  Do not eat a lot of foods that are high in solid fats, added sugars, or sodium. Maintain a healthy weight Body mass index (BMI) is used to identify weight problems. It estimates body fat based on height and weight. Your health care provider can help determine your BMI and help you achieve or maintain a healthy weight. Get regular exercise Get regular exercise. This is one of the most important things you can do for your health. Most adults should:  Exercise for at least 150 minutes each week. The exercise should increase your heart rate and make you sweat (moderate-intensity exercise).  Do strengthening exercises at least twice a week. This is in addition to the moderate-intensity exercise.  Spend less time sitting. Even light physical activity can be beneficial. Watch cholesterol and blood lipids Have your blood tested for lipids and cholesterol at 40 years of age, then  have this test every 5 years. Have your cholesterol levels checked more often if:  Your lipid or cholesterol levels are high.  You are older than 40 years of age.  You are at high risk for heart disease. What should I know about cancer screening? Depending on your health history and family history, you may need to have cancer screening at various ages. This may include screening for:  Breast cancer.  Cervical cancer.  Colorectal cancer.  Skin cancer.  Lung cancer. What should I know about heart disease, diabetes, and high blood pressure? Blood pressure and heart disease  High blood pressure causes heart disease and increases the risk of stroke. This is more likely to develop in people who have high blood pressure readings, are of African descent, or are overweight.  Have your blood pressure checked: ? Every 3-5 years if you are 34-30 years of age. ? Every year if you are 78 years old or older. Diabetes Have regular diabetes screenings. This checks your fasting blood sugar level. Have the screening done:  Once every three years after age 65 if you are at a normal weight and have a low risk for diabetes.  More often and at a younger age if you are overweight or have a high risk for diabetes. What should I know about preventing infection? Hepatitis B If you have a higher risk for hepatitis B, you should be screened for this virus. Talk with your health care provider to find out if you are at risk for hepatitis B infection. Hepatitis C  Testing is recommended for:  Everyone born from [birth year] through 1966.  Anyone with known risk factors for hepatitis C. Sexually transmitted infections (STIs)  Get screened for STIs, including gonorrhea and chlamydia, if: ? You are sexually active and are younger than 40 years of age. ? You are older than 40 years of age and your health care provider tells you that you are at risk for this type of infection. ? Your sexual activity has changed  since you were last screened, and you are at increased risk for chlamydia or gonorrhea. Ask your health care provider if you are at risk.  Ask your health care provider about whether you are at high risk for HIV. Your health care provider may recommend a prescription medicine to help prevent HIV infection. If you choose to take medicine to prevent HIV, you should first get tested for HIV. You should then be tested every 3 months for as long as you are taking the medicine. Pregnancy  If you are about to stop having your period (premenopausal) and you may become pregnant, seek counseling before you get pregnant.  Take 400 to 800 micrograms (mcg) of folic acid every day if you become pregnant.  Ask for birth control (contraception) if you want to prevent pregnancy. Osteoporosis and menopause Osteoporosis is a disease in which the bones lose minerals and strength with aging. This can result in bone fractures. If you are 93 years old or older, or if you are at risk for osteoporosis and fractures, ask your health care provider if you should:  Be screened for bone loss.  Take a calcium or vitamin D supplement to lower your risk of fractures.  Be given hormone replacement therapy (HRT) to treat symptoms of menopause. Follow these instructions at home: Lifestyle  Do not use any products that contain nicotine or tobacco, such as cigarettes, e-cigarettes, and chewing tobacco. If you need help quitting, ask your health care provider.  Do not use street drugs.  Do not share needles.  Ask your health care provider for help if you need support or information about quitting drugs. Alcohol use  Do not drink alcohol if: ? Your health care provider tells you not to drink. ? You are pregnant, may be pregnant, or are planning to become pregnant.  If you drink alcohol: ? Limit how much you use to 0-1 drink a day. ? Limit intake if you are breastfeeding.  Be aware of how much alcohol is in your drink.  In the [place] one drink equals one 12 oz bottle of beer (355 mL), one 5 oz glass of wine (148 mL), or one 1 oz glass of hard liquor (44 mL). General instructions  Schedule regular health, dental, and eye exams.  Stay current with your vaccines.  Tell your health care provider if: ? You often feel depressed. ? You have ever been abused or do not feel safe at home. Summary  Adopting a healthy lifestyle and getting preventive care are important in promoting health and wellness.  Follow your health care provider's instructions about healthy diet, exercising, and getting tested or screened for diseases.  Follow your health care provider's instructions on monitoring your cholesterol and blood pressure. This information is not intended to replace advice given to you by your health care provider. Make sure you discuss any questions you have with your health care provider. Document Revised: 08/28/2018 Document Reviewed: 08/28/2018 Elsevier Patient Education  2020 [employer].

## 2019-11-24 NOTE — Assessment & Plan Note (Addendum)
Elevated today.  Patient politely declines any medication at this time, she would like to monitor at home.  Advised her of blood pressure goal less than 120/80.  Patient will call with readings.

## 2019-11-24 NOTE — Telephone Encounter (Signed)
noted 

## 2019-11-24 NOTE — Progress Notes (Signed)
Subjective:    Patient ID: Julie Alexander, female    DOB: 1980/10/06, 39 y.o.   MRN: [id number]  CC: Julie Alexander is a 39 y.o. female who presents today for physical exam.    HPI: Feels well today. Has been working on [employer].   Jogging 5-6 days per week. Has gone from size 18 to 10.  Starting drinking more water, and not adding salt.   HTN- not on amlodipine as felt with weightloss didn't need medication. No CP, shortness of breath, palpitations    History of von Willebrand disease  Colorectal Cancer Screening: no early family history Breast Cancer Screening: Mammogram due Cervical Cancer Screening: Hysterectomy due to PCOS; unsure if has a cervix. No vaginal bleeding.         Tetanus - due        HIV Screening- Candidate for , declines.  Labs: Screening labs today. Exercise: Gets regular exercise.  Alcohol use: none Smoking/tobacco use: Nonsmoker.    HISTORY:  Past Medical History:  Diagnosis Date  . Depression   . Emphysema of lung ([place])   . Frequent headaches   . Heart murmur   . Hypertension   . Migraines   . PCOS (polycystic ovarian syndrome)   . Von Willebrand disease ([place])     Past Surgical History:  Procedure Laterality Date  . ABDOMINAL HYSTERECTOMY     partial; done due to PCOS  . CHOLECYSTECTOMY  2011  . DILATION AND CURETTAGE OF UTERUS     Family History  Problem Relation Age of Onset  . Breast cancer Maternal Aunt 30       cancer  . Hypertension Mother   . Cancer Father        blood cancer ; unsure of name  . Heart attack Sister 55  . Heart attack Brother   . Ovarian cancer Neg Hx   . Colon cancer Neg Hx       ALLERGIES: Patient has no known allergies.  Current Outpatient Medications on File Prior to Visit  Medication Sig Dispense Refill  . amLODipine (NORVASC) 5 MG tablet Take 1 tablet (5 mg total) by mouth daily. (Patient not taking: Reported on 11/24/2019) 90 tablet 1   No current facility-administered medications on file  prior to visit.    Social History   Tobacco Use  . Smoking status: Never Smoker  . Smokeless tobacco: Never Used  Substance Use Topics  . Alcohol use: No  . Drug use: No    Review of Systems  Constitutional: Negative for chills, fever and unexpected weight change.  HENT: Negative for congestion.   Eyes: Negative for visual disturbance.  Respiratory: Negative for cough.   Cardiovascular: Negative for chest pain, palpitations and leg swelling.  Gastrointestinal: Negative for nausea and vomiting.  Musculoskeletal: Negative for arthralgias and myalgias.  Skin: Negative for rash.  Neurological: Negative for headaches.  Hematological: Negative for adenopathy.  Psychiatric/Behavioral: Negative for confusion.      Objective:    BP 140/84   Pulse 78   Temp (!) 97.3 F (36.3 C)   Ht 5' 2.95" (1.599 m)   Wt 219 lb 3.2 oz (99.4 kg)   SpO2 98%   BMI 38.89 kg/m   BP Readings from Last 3 Encounters:  11/24/19 140/84  11/28/18 (!) 148/80  11/14/18 (!) 150/90   Wt Readings from Last 3 Encounters:  11/24/19 219 lb 3.2 oz (99.4 kg)  02/28/18 229 lb 2 oz (103.9 kg)  05/10/17 232  lb (105.2 kg)    Physical Exam Vitals reviewed.  Constitutional:      Appearance: She is well-developed.  Eyes:     Conjunctiva/sclera: Conjunctivae normal.  Neck:     Thyroid: No thyroid mass or thyromegaly.  Cardiovascular:     Rate and Rhythm: Normal rate and regular rhythm.     Pulses: Normal pulses.     Heart sounds: Normal heart sounds.  Pulmonary:     Effort: Pulmonary effort is normal.     Breath sounds: Normal breath sounds. No wheezing, rhonchi or rales.  Lymphadenopathy:     Head:     Right side of head: No submental, submandibular, tonsillar, preauricular, posterior auricular or occipital adenopathy.     Left side of head: No submental, submandibular, tonsillar, preauricular, posterior auricular or occipital adenopathy.     Cervical: No cervical adenopathy.  Skin:    General:  Skin is warm and dry.  Neurological:     Mental Status: She is alert.  Psychiatric:        Speech: Speech normal.        Behavior: Behavior normal.        Thought Content: Thought content normal.        Assessment & Plan:   Problem List Items Addressed This Visit      Cardiovascular and Mediastinum   Hypertension    Elevated today.  Patient politely declines any medication at this time, she would like to monitor at home.  Advised her of blood pressure goal less than 120/80.  Patient will call with readings.         Other   Routine physical examination - Primary    Declines pelvic exam to determine if she has a cervix. Would like to call previous OB GYN, Dr Alphonzo Lemmings.  She is aware that if she has a cervix, then she would require pelvic exam with Pap smear.  She will schedule her mammogram at [facility], this has been ordered.  Patient politely declines clinical breast exam in the office today.      Relevant Orders   MM 3D SCREEN BREAST BILATERAL   Comprehensive metabolic panel   CBC with Differential/Platelet   Hemoglobin A1c   Lipid panel   TSH   VITAMIN D 25 Hydroxy (Vit-D Deficiency, Fractures)    Other Visit Diagnoses    Need for Tdap vaccination       Relevant Orders   Tdap vaccine greater than or equal to 7yo IM (Completed)       I have discontinued Jaice A. O'Brien's amoxicillin-clavulanate, HYDROcodone-homatropine, amoxicillin-clavulanate, and methylPREDNISolone. I am also having her maintain her amLODipine.   No orders of the defined types were placed in this encounter.   Return precautions given.   Risks, benefits, and alternatives of the medications and treatment plan prescribed today were discussed, and patient expressed understanding.   Education regarding symptom management and diagnosis given to patient on AVS.   Continue to follow with Allegra Grana, FNP for routine health maintenance.   Geoffry Paradise and I agreed with plan.   Rennie Plowman, FNP

## 2019-12-09 ENCOUNTER — Other Ambulatory Visit (INDEPENDENT_AMBULATORY_CARE_PROVIDER_SITE_OTHER): Payer: 59

## 2019-12-09 ENCOUNTER — Other Ambulatory Visit: Payer: Self-pay

## 2019-12-09 DIAGNOSIS — Z0000 Encounter for general adult medical examination without abnormal findings: Secondary | ICD-10-CM

## 2019-12-09 LAB — CBC WITH DIFFERENTIAL/PLATELET
Basophils Absolute: 0.1 10*3/uL (ref 0.0–0.1)
Basophils Relative: 0.7 % (ref 0.0–3.0)
Eosinophils Absolute: 0.1 10*3/uL (ref 0.0–0.7)
Eosinophils Relative: 0.9 % (ref 0.0–5.0)
HCT: 39.4 % (ref 36.0–46.0)
Hemoglobin: 13 g/dL (ref 12.0–15.0)
Lymphocytes Relative: 41.5 % (ref 12.0–46.0)
Lymphs Abs: 3.6 10*3/uL (ref 0.7–4.0)
MCHC: 33 g/dL (ref 30.0–36.0)
MCV: 84 fl (ref 78.0–100.0)
Monocytes Absolute: 0.4 10*3/uL (ref 0.1–1.0)
Monocytes Relative: 4.4 % (ref 3.0–12.0)
Neutro Abs: 4.6 10*3/uL (ref 1.4–7.7)
Neutrophils Relative %: 52.5 % (ref 43.0–77.0)
Platelets: 379 10*3/uL (ref 150.0–400.0)
RBC: 4.69 Mil/uL (ref 3.87–5.11)
RDW: 13.6 % (ref 11.5–15.5)
WBC: 8.7 10*3/uL (ref 4.0–10.5)

## 2019-12-09 LAB — COMPREHENSIVE METABOLIC PANEL
ALT: 19 U/L (ref 0–35)
AST: 13 U/L (ref 0–37)
Albumin: 4.1 g/dL (ref 3.5–5.2)
Alkaline Phosphatase: 90 U/L (ref 39–117)
BUN: 18 mg/dL (ref 6–23)
CO2: 25 mEq/L (ref 19–32)
Calcium: 9.1 mg/dL (ref 8.4–10.5)
Chloride: 106 mEq/L (ref 96–112)
Creatinine, Ser: 1 mg/dL (ref 0.40–1.20)
GFR: 74.28 mL/min (ref >60.00)
Glucose, Bld: 96 mg/dL (ref 70–99)
Potassium: 3.6 mEq/L (ref 3.5–5.1)
Sodium: 138 mEq/L (ref 135–145)
Total Bilirubin: 0.3 mg/dL (ref 0.2–1.2)
Total Protein: 7.5 g/dL (ref 6.0–8.3)

## 2019-12-09 LAB — TSH: TSH: 2.79 u[IU]/mL (ref 0.35–4.50)

## 2019-12-09 LAB — HEMOGLOBIN A1C: Hgb A1c MFr Bld: 5.5 % (ref 4.6–6.5)

## 2019-12-09 LAB — LIPID PANEL
Cholesterol: 199 mg/dL (ref 0–200)
HDL: 37.1 mg/dL — ABNORMAL LOW (ref >39.00)
LDL Cholesterol: 139 mg/dL — ABNORMAL HIGH (ref 0–99)
NonHDL: 161.72
Total CHOL/HDL Ratio: 5
Triglycerides: 113 mg/dL (ref 0.0–149.0)
VLDL: 22.6 mg/dL (ref 0.0–40.0)

## 2019-12-09 LAB — VITAMIN D 25 HYDROXY (VIT D DEFICIENCY, FRACTURES): VITD: 14.84 ng/mL — ABNORMAL LOW (ref 30.00–100.00)

## 2019-12-12 ENCOUNTER — Ambulatory Visit
Admission: RE | Admit: 2019-12-12 | Discharge: 2019-12-12 | Disposition: A | Discharge status: Home / Self Care | Payer: 59 | Source: Ambulatory Visit | Attending: Family | Admitting: Family

## 2019-12-12 ENCOUNTER — Encounter: Payer: 59 | Admitting: Family

## 2019-12-12 DIAGNOSIS — Z1231 Encounter for screening mammogram for malignant neoplasm of breast: Secondary | ICD-10-CM | POA: Diagnosis present

## 2019-12-12 DIAGNOSIS — Z0000 Encounter for general adult medical examination without abnormal findings: Secondary | ICD-10-CM

## 2021-08-16 ENCOUNTER — Telehealth: Payer: Self-pay | Admitting: Family

## 2021-08-16 NOTE — Telephone Encounter (Signed)
Pt called in regards to TOC. Pt has moved to [place] and has to have a procedure done so she needs the history of her weight while in this facility. Pt says it can be mailed or emailed to her.   Pts new address is: [address]  [place], [place]. [address]   Email:  [email or web address]

## 2021-08-17 ENCOUNTER — Other Ambulatory Visit: Payer: Self-pay

## 2021-08-18 NOTE — Telephone Encounter (Signed)
I called patient to let her know that I have emailed OV notes from the last four years dating back to 2018. Pt will let us know if issues.

## 2022-02-11 IMAGING — MG DIGITAL SCREENING BILAT W/ TOMO W/ CAD
6 of 10 series · 6 of 30 positions shown · non-contrast
Comparison: Previous exam(s).

CLINICAL DATA: Screening.

EXAM:
DIGITAL SCREENING BILATERAL MAMMOGRAM WITH TOMO AND CAD

[L MLO synth-2D (1 of 2)]
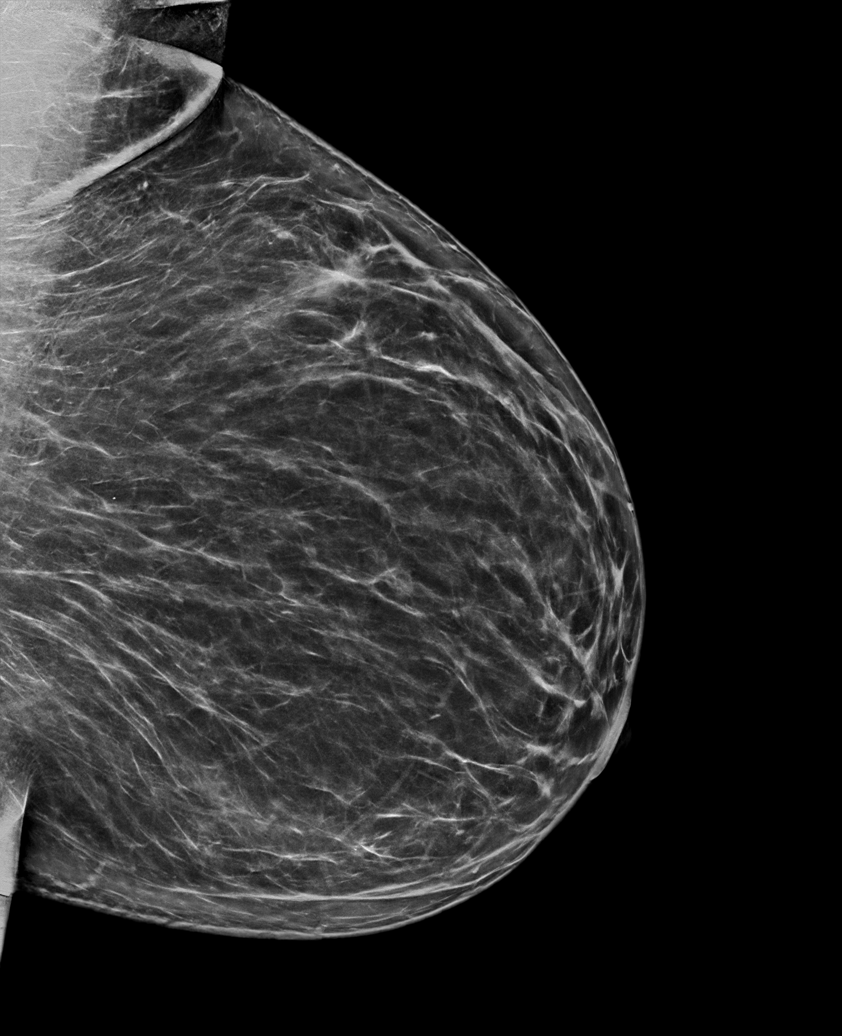

[R MLO synth-2D]
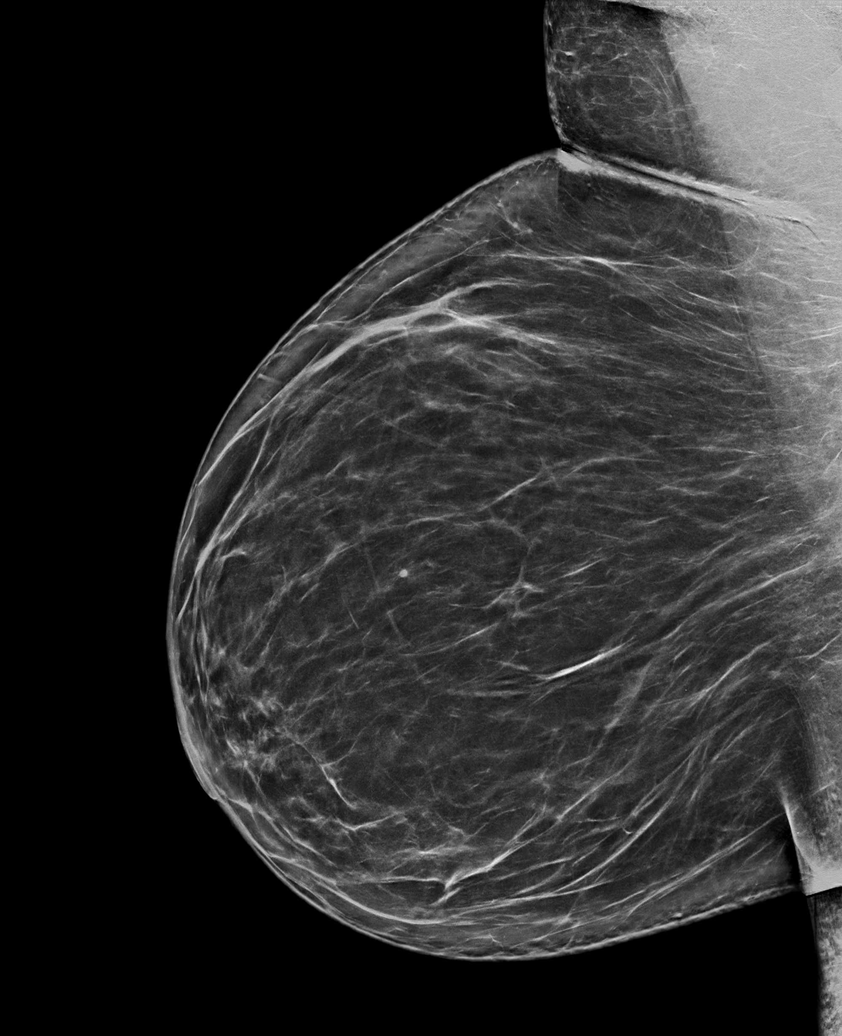

[L MLO synth-2D (2 of 2)]
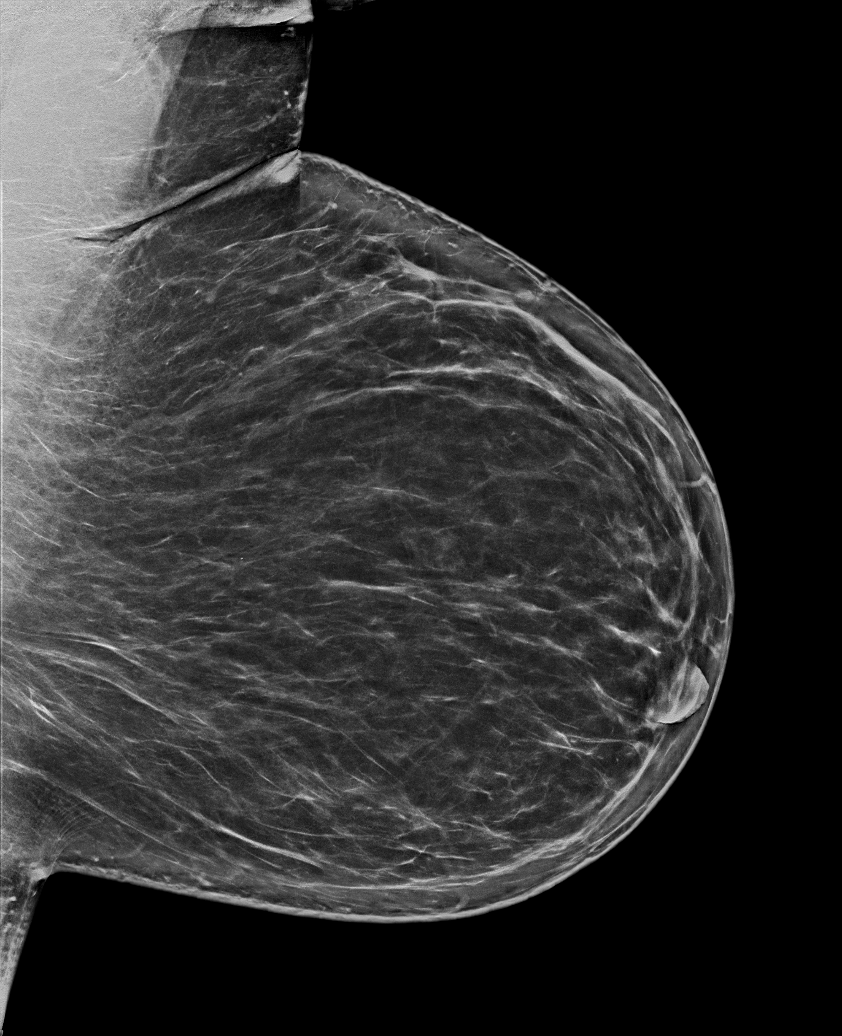

[R CC synth-2D]
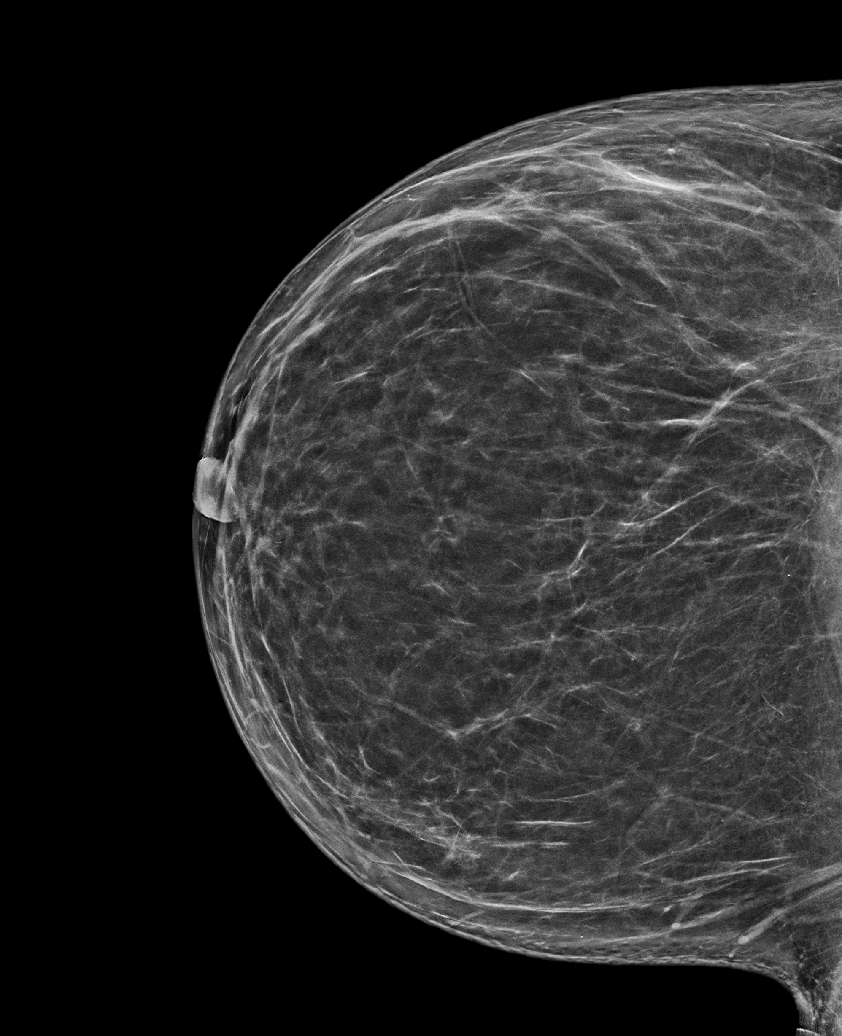

[L CC synth-2D]
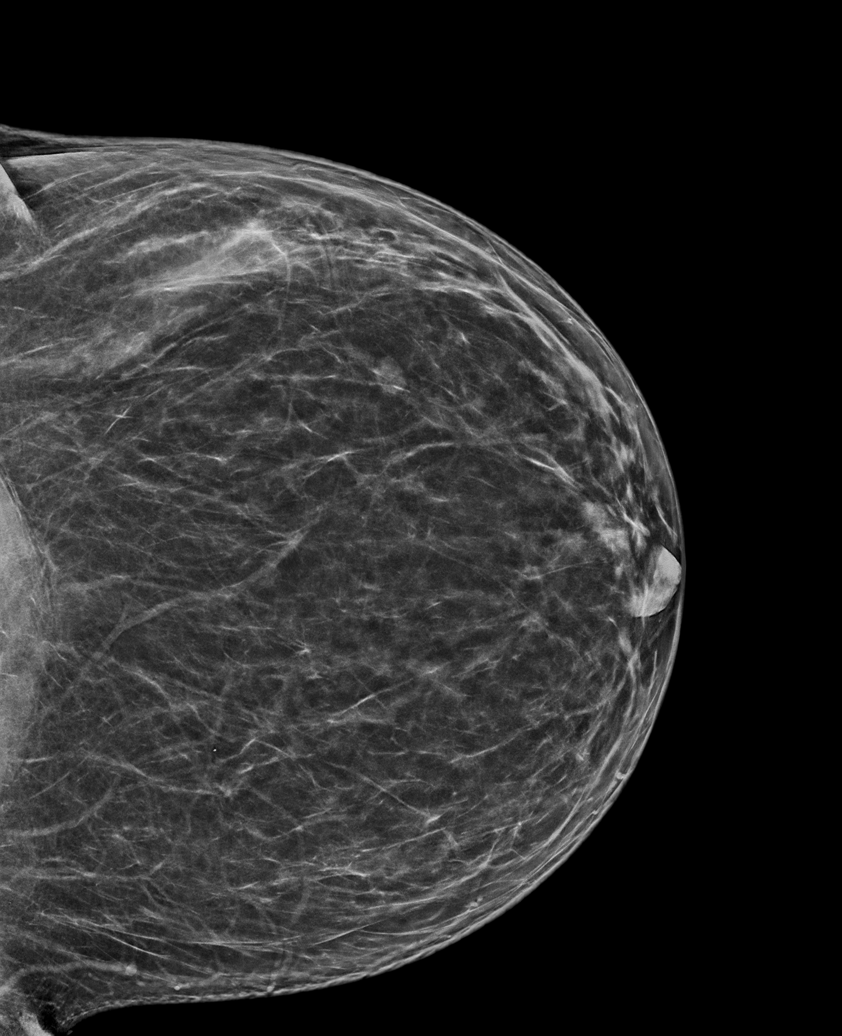

[R CC tomo · tomo slice 41/82.0]
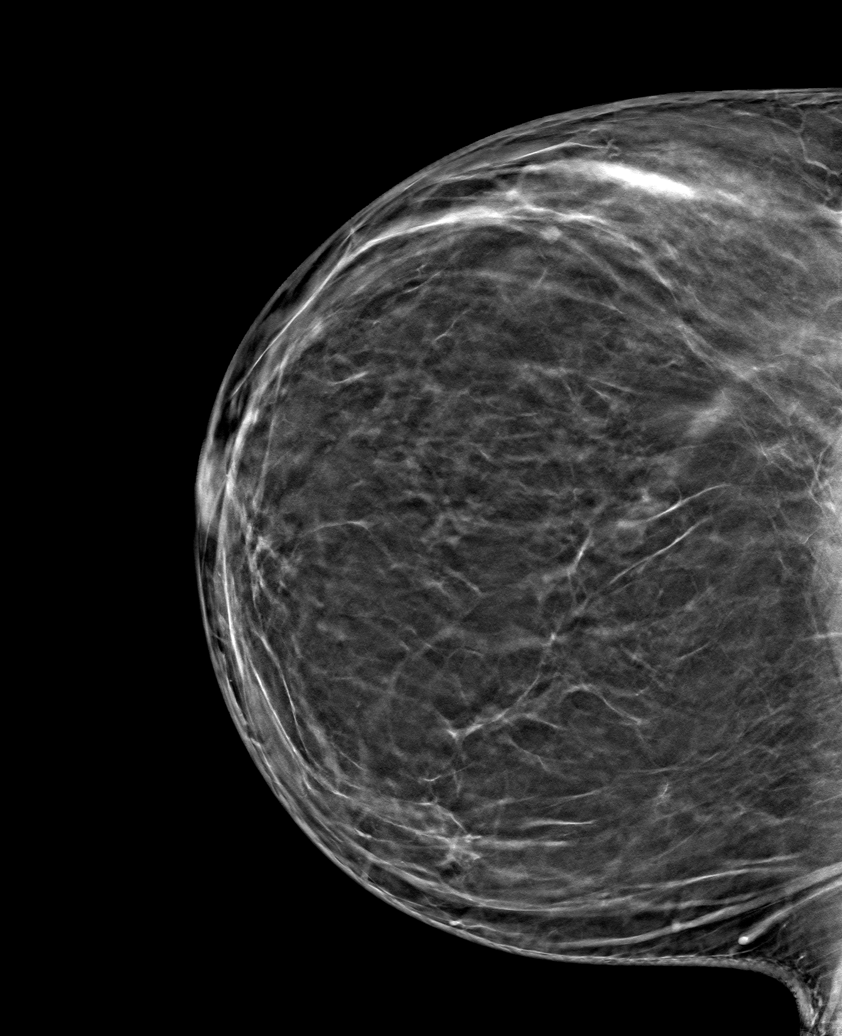

[6 of 30 positions shown; findings below may reference images not displayed]

ACR Breast Density Category b: There are scattered areas of
fibroglandular density.
FINDINGS: There are no findings suspicious for malignancy. Images were
processed with CAD.
IMPRESSION: No mammographic evidence of malignancy. A result letter of this
screening mammogram will be mailed directly to the patient.

RECOMMENDATION:
Screening mammogram in one year. (Code:[id number])

BI-RADS CATEGORY  1: Negative.
